# Patient Record
Sex: Female | Born: 1945
Health system: Southern US, Community
[De-identification: ages and names within clinical notes are randomized; demographics above are authoritative.]

## PROBLEM LIST (undated history)

## (undated) DIAGNOSIS — I10 Essential (primary) hypertension: Secondary | ICD-10-CM

## (undated) DIAGNOSIS — E785 Hyperlipidemia, unspecified: Secondary | ICD-10-CM

## (undated) HISTORY — DX: Hyperlipidemia, unspecified: E78.5

## (undated) HISTORY — DX: Essential (primary) hypertension: I10

---

## 1988-11-26 HISTORY — PX: BREAST BIOPSY: SHX20

## 1989-11-26 HISTORY — PX: BREAST BIOPSY: SHX20

## 1990-11-26 HISTORY — PX: KNEE SURGERY: SHX244

## 1990-11-26 HISTORY — PX: BREAST BIOPSY: SHX20

## 1996-11-26 HISTORY — PX: BREAST BIOPSY: SHX20

## 1999-03-10 ENCOUNTER — Other Ambulatory Visit: Admission: RE | Admit: 1999-03-10 | Discharge: 1999-03-10 | Payer: Self-pay | Admitting: *Deleted

## 2000-04-17 ENCOUNTER — Other Ambulatory Visit: Admission: RE | Admit: 2000-04-17 | Discharge: 2000-04-17 | Payer: Self-pay | Admitting: *Deleted

## 2000-07-01 ENCOUNTER — Encounter: Payer: Self-pay | Admitting: *Deleted

## 2000-07-01 ENCOUNTER — Encounter: Admission: RE | Admit: 2000-07-01 | Discharge: 2000-07-01 | Payer: Self-pay | Admitting: *Deleted

## 2000-08-01 ENCOUNTER — Encounter: Admission: RE | Admit: 2000-08-01 | Discharge: 2000-08-01 | Payer: Self-pay | Admitting: Family Medicine

## 2000-08-01 ENCOUNTER — Encounter: Payer: Self-pay | Admitting: Family Medicine

## 2001-07-09 ENCOUNTER — Encounter: Payer: Self-pay | Admitting: *Deleted

## 2001-07-09 ENCOUNTER — Encounter: Admission: RE | Admit: 2001-07-09 | Discharge: 2001-07-09 | Payer: Self-pay | Admitting: *Deleted

## 2001-07-15 ENCOUNTER — Encounter: Admission: RE | Admit: 2001-07-15 | Discharge: 2001-07-15 | Payer: Self-pay | Admitting: *Deleted

## 2001-07-15 ENCOUNTER — Encounter: Payer: Self-pay | Admitting: *Deleted

## 2001-08-07 ENCOUNTER — Other Ambulatory Visit: Admission: RE | Admit: 2001-08-07 | Discharge: 2001-08-07 | Payer: Self-pay | Admitting: *Deleted

## 2002-07-21 ENCOUNTER — Encounter: Admission: RE | Admit: 2002-07-21 | Discharge: 2002-07-21 | Payer: Self-pay | Admitting: *Deleted

## 2002-07-21 ENCOUNTER — Encounter: Payer: Self-pay | Admitting: *Deleted

## 2002-09-11 ENCOUNTER — Other Ambulatory Visit: Admission: RE | Admit: 2002-09-11 | Discharge: 2002-09-11 | Payer: Self-pay | Admitting: *Deleted

## 2003-07-29 ENCOUNTER — Encounter: Admission: RE | Admit: 2003-07-29 | Discharge: 2003-07-29 | Payer: Self-pay | Admitting: *Deleted

## 2003-07-29 ENCOUNTER — Encounter: Payer: Self-pay | Admitting: *Deleted

## 2003-09-16 ENCOUNTER — Other Ambulatory Visit: Admission: RE | Admit: 2003-09-16 | Discharge: 2003-09-16 | Payer: Self-pay | Admitting: *Deleted

## 2004-08-18 ENCOUNTER — Encounter: Admission: RE | Admit: 2004-08-18 | Discharge: 2004-08-18 | Payer: Self-pay | Admitting: *Deleted

## 2004-10-23 ENCOUNTER — Other Ambulatory Visit: Admission: RE | Admit: 2004-10-23 | Discharge: 2004-10-23 | Payer: Self-pay | Admitting: *Deleted

## 2004-10-26 ENCOUNTER — Encounter: Admission: RE | Admit: 2004-10-26 | Discharge: 2004-10-26 | Payer: Self-pay | Admitting: *Deleted

## 2004-11-07 ENCOUNTER — Ambulatory Visit (HOSPITAL_COMMUNITY): Admission: RE | Admit: 2004-11-07 | Discharge: 2004-11-07 | Payer: Self-pay | Admitting: Gastroenterology

## 2005-09-10 ENCOUNTER — Encounter: Admission: RE | Admit: 2005-09-10 | Discharge: 2005-09-10 | Payer: Self-pay | Admitting: Family Medicine

## 2005-12-18 ENCOUNTER — Other Ambulatory Visit: Admission: RE | Admit: 2005-12-18 | Discharge: 2005-12-18 | Payer: Self-pay | Admitting: Family Medicine

## 2006-09-16 ENCOUNTER — Encounter: Admission: RE | Admit: 2006-09-16 | Discharge: 2006-09-16 | Payer: Self-pay | Admitting: Family Medicine

## 2007-03-04 ENCOUNTER — Other Ambulatory Visit: Admission: RE | Admit: 2007-03-04 | Discharge: 2007-03-04 | Payer: Self-pay | Admitting: Family Medicine

## 2007-03-13 ENCOUNTER — Encounter: Admission: RE | Admit: 2007-03-13 | Discharge: 2007-03-13 | Payer: Self-pay | Admitting: Family Medicine

## 2007-09-19 ENCOUNTER — Encounter: Admission: RE | Admit: 2007-09-19 | Discharge: 2007-09-19 | Payer: Self-pay | Admitting: Family Medicine

## 2008-07-02 ENCOUNTER — Other Ambulatory Visit: Admission: RE | Admit: 2008-07-02 | Discharge: 2008-07-02 | Payer: Self-pay | Admitting: Family Medicine

## 2008-09-20 ENCOUNTER — Encounter: Admission: RE | Admit: 2008-09-20 | Discharge: 2008-09-20 | Payer: Self-pay | Admitting: Family Medicine

## 2009-04-04 ENCOUNTER — Encounter: Admission: RE | Admit: 2009-04-04 | Discharge: 2009-04-04 | Payer: Self-pay | Admitting: Emergency Medicine

## 2009-07-06 ENCOUNTER — Other Ambulatory Visit: Admission: RE | Admit: 2009-07-06 | Discharge: 2009-07-06 | Payer: Self-pay | Admitting: Family Medicine

## 2009-07-28 ENCOUNTER — Encounter: Admission: RE | Admit: 2009-07-28 | Discharge: 2009-07-28 | Payer: Self-pay | Admitting: Family Medicine

## 2009-09-29 ENCOUNTER — Encounter: Admission: RE | Admit: 2009-09-29 | Discharge: 2009-09-29 | Payer: Self-pay | Admitting: Family Medicine

## 2010-09-08 ENCOUNTER — Other Ambulatory Visit: Admission: RE | Admit: 2010-09-08 | Discharge: 2010-09-08 | Payer: Self-pay | Admitting: Family Medicine

## 2010-10-02 ENCOUNTER — Encounter: Admission: RE | Admit: 2010-10-02 | Discharge: 2010-10-02 | Payer: Self-pay | Admitting: Family Medicine

## 2011-04-13 NOTE — Op Note (Signed)
NAMESARAHGRACE, BROMAN            ACCOUNT NO.:  [id number]   MEDICAL RECORD NO.:  [id number]          PATIENT TYPE:  AMB   LOCATION:  ENDO                         FACILITY:  [facility]   PHYSICIAN:  James L. Malon Kindle., M.D.DATE OF BIRTH:  1946-03-19   DATE OF PROCEDURE:  11/07/2004  DATE OF DISCHARGE:                                 OPERATIVE REPORT   PROCEDURE PERFORMED:  Colonoscopy.   ENDOSCOPIST:  Llana Aliment. Edwards, M.D.   MEDICATIONS:  Fentanyl 75 mcg, Versed 7.5 mg IV.   INSTRUMENT USED:  Pediatric Olympus video colonoscope.   INDICATIONS FOR PROCEDURE:  Colon cancer screening.   DESCRIPTION OF PROCEDURE:  The procedure had been explained to the patient  and consent obtained.  With the patient in the left lateral decubitus  position, the pediatric Olympus colonoscope was inserted and advanced.  The  prep was excellent.  The patient had a somewhat long, tortuous colon and we  were able to reach the cecum.  The ileocecal valve was seen.  The scope was  withdrawn and the cecum, ascending colon, transverse colon, splenic flexure,  descending and sigmoid colon were seen well upon removal.  No polyps were  seen.  There as no diverticular disease.  The scope was withdrawn.  The  patient tolerated the procedure well.   ASSESSMENT:  Essentially normal screening colonoscopy.  [id number].   PLAN:  Will recommend yearly Hemoccults, possibly repeat procedure in 10  years.       JLE/MEDQ  D:  11/07/2004  T:  11/07/2004  Job:  [id number]   cc:   Duncan Dull, M.D.  [address]  [place]  [place] [address]  Fax: [phone]

## 2011-08-23 LAB — LAB REPORT - SCANNED: EGFR: 78.3

## 2011-09-11 ENCOUNTER — Other Ambulatory Visit: Payer: Self-pay | Admitting: Family Medicine

## 2011-09-11 DIAGNOSIS — Z1231 Encounter for screening mammogram for malignant neoplasm of breast: Secondary | ICD-10-CM

## 2011-10-04 ENCOUNTER — Ambulatory Visit
Admission: RE | Admit: 2011-10-04 | Discharge: 2011-10-04 | Disposition: A | Discharge status: Home / Self Care | Payer: PRIVATE HEALTH INSURANCE | Source: Ambulatory Visit | Attending: Family Medicine | Admitting: Family Medicine

## 2011-10-04 DIAGNOSIS — Z1231 Encounter for screening mammogram for malignant neoplasm of breast: Secondary | ICD-10-CM

## 2012-09-30 ENCOUNTER — Other Ambulatory Visit: Payer: Self-pay | Admitting: Family Medicine

## 2012-09-30 DIAGNOSIS — Z1231 Encounter for screening mammogram for malignant neoplasm of breast: Secondary | ICD-10-CM

## 2012-11-10 ENCOUNTER — Ambulatory Visit
Admission: RE | Admit: 2012-11-10 | Discharge: 2012-11-10 | Disposition: A | Discharge status: Home / Self Care | Payer: BC Managed Care – PPO | Source: Ambulatory Visit | Attending: Family Medicine | Admitting: Family Medicine

## 2012-11-10 DIAGNOSIS — Z1231 Encounter for screening mammogram for malignant neoplasm of breast: Secondary | ICD-10-CM

## 2013-02-26 ENCOUNTER — Other Ambulatory Visit: Payer: Self-pay | Admitting: Family Medicine

## 2013-02-26 DIAGNOSIS — M8580 Other specified disorders of bone density and structure, unspecified site: Secondary | ICD-10-CM

## 2013-03-24 ENCOUNTER — Other Ambulatory Visit: Payer: BC Managed Care – PPO

## 2013-04-29 ENCOUNTER — Ambulatory Visit
Admission: RE | Admit: 2013-04-29 | Discharge: 2013-04-29 | Disposition: A | Discharge status: Home / Self Care | Payer: BC Managed Care – HMO | Source: Ambulatory Visit | Attending: Family Medicine | Admitting: Family Medicine

## 2013-04-29 DIAGNOSIS — M8580 Other specified disorders of bone density and structure, unspecified site: Secondary | ICD-10-CM

## 2013-04-29 LAB — HM DEXA SCAN

## 2013-10-26 ENCOUNTER — Other Ambulatory Visit: Payer: Self-pay

## 2013-10-26 DIAGNOSIS — Z1231 Encounter for screening mammogram for malignant neoplasm of breast: Secondary | ICD-10-CM

## 2013-12-14 ENCOUNTER — Ambulatory Visit
Admission: RE | Admit: 2013-12-14 | Discharge: 2013-12-14 | Disposition: A | Discharge status: Home / Self Care | Payer: BC Managed Care – PPO | Source: Ambulatory Visit

## 2013-12-14 DIAGNOSIS — Z1231 Encounter for screening mammogram for malignant neoplasm of breast: Secondary | ICD-10-CM

## 2014-03-04 LAB — LAB REPORT - SCANNED: EGFR: 75

## 2014-11-23 ENCOUNTER — Other Ambulatory Visit: Payer: Self-pay

## 2014-11-23 DIAGNOSIS — Z1231 Encounter for screening mammogram for malignant neoplasm of breast: Secondary | ICD-10-CM

## 2014-12-17 ENCOUNTER — Ambulatory Visit: Payer: BC Managed Care – PPO

## 2014-12-22 ENCOUNTER — Ambulatory Visit
Admission: RE | Admit: 2014-12-22 | Discharge: 2014-12-22 | Disposition: A | Discharge status: Home / Self Care | Payer: Self-pay | Source: Ambulatory Visit

## 2014-12-22 DIAGNOSIS — Z1231 Encounter for screening mammogram for malignant neoplasm of breast: Secondary | ICD-10-CM

## 2015-11-16 ENCOUNTER — Other Ambulatory Visit: Payer: Self-pay

## 2015-11-16 DIAGNOSIS — Z1231 Encounter for screening mammogram for malignant neoplasm of breast: Secondary | ICD-10-CM

## 2015-12-26 ENCOUNTER — Ambulatory Visit: Payer: BLUE CROSS/BLUE SHIELD

## 2015-12-27 ENCOUNTER — Ambulatory Visit
Admission: RE | Admit: 2015-12-27 | Discharge: 2015-12-27 | Disposition: A | Discharge status: Home / Self Care | Payer: BLUE CROSS/BLUE SHIELD | Source: Ambulatory Visit

## 2015-12-27 DIAGNOSIS — Z1231 Encounter for screening mammogram for malignant neoplasm of breast: Secondary | ICD-10-CM

## 2016-03-15 ENCOUNTER — Other Ambulatory Visit: Payer: Self-pay | Admitting: Family Medicine

## 2016-03-15 DIAGNOSIS — M8580 Other specified disorders of bone density and structure, unspecified site: Secondary | ICD-10-CM

## 2016-04-03 ENCOUNTER — Ambulatory Visit
Admission: RE | Admit: 2016-04-03 | Discharge: 2016-04-03 | Disposition: A | Discharge status: Home / Self Care | Payer: BLUE CROSS/BLUE SHIELD | Source: Ambulatory Visit | Attending: Family Medicine | Admitting: Family Medicine

## 2016-04-03 DIAGNOSIS — M8580 Other specified disorders of bone density and structure, unspecified site: Secondary | ICD-10-CM

## 2016-11-28 ENCOUNTER — Other Ambulatory Visit: Payer: Self-pay | Admitting: Family Medicine

## 2016-11-28 DIAGNOSIS — Z1231 Encounter for screening mammogram for malignant neoplasm of breast: Secondary | ICD-10-CM

## 2016-12-27 ENCOUNTER — Ambulatory Visit: Payer: BLUE CROSS/BLUE SHIELD

## 2017-01-02 ENCOUNTER — Ambulatory Visit
Admission: RE | Admit: 2017-01-02 | Discharge: 2017-01-02 | Disposition: A | Discharge status: Home / Self Care | Payer: BLUE CROSS/BLUE SHIELD | Source: Ambulatory Visit | Attending: Family Medicine | Admitting: Family Medicine

## 2017-01-02 DIAGNOSIS — Z1231 Encounter for screening mammogram for malignant neoplasm of breast: Secondary | ICD-10-CM

## 2017-03-27 LAB — LAB REPORT - SCANNED: EGFR: 77

## 2017-11-22 ENCOUNTER — Other Ambulatory Visit: Payer: Self-pay | Admitting: Family Medicine

## 2017-11-22 DIAGNOSIS — Z1231 Encounter for screening mammogram for malignant neoplasm of breast: Secondary | ICD-10-CM

## 2017-12-05 DIAGNOSIS — R69 Illness, unspecified: Secondary | ICD-10-CM | POA: Diagnosis not present

## 2017-12-12 DIAGNOSIS — R69 Illness, unspecified: Secondary | ICD-10-CM | POA: Diagnosis not present

## 2017-12-14 ENCOUNTER — Encounter (HOSPITAL_COMMUNITY): Payer: Self-pay | Admitting: Emergency Medicine

## 2017-12-14 ENCOUNTER — Other Ambulatory Visit: Payer: Self-pay

## 2017-12-14 DIAGNOSIS — R0602 Shortness of breath: Secondary | ICD-10-CM | POA: Insufficient documentation

## 2017-12-14 DIAGNOSIS — R4182 Altered mental status, unspecified: Secondary | ICD-10-CM | POA: Diagnosis not present

## 2017-12-14 DIAGNOSIS — R42 Dizziness and giddiness: Secondary | ICD-10-CM | POA: Diagnosis not present

## 2017-12-14 DIAGNOSIS — R7989 Other specified abnormal findings of blood chemistry: Secondary | ICD-10-CM | POA: Diagnosis not present

## 2017-12-14 DIAGNOSIS — R55 Syncope and collapse: Secondary | ICD-10-CM | POA: Diagnosis not present

## 2017-12-14 DIAGNOSIS — R404 Transient alteration of awareness: Secondary | ICD-10-CM | POA: Diagnosis not present

## 2017-12-14 DIAGNOSIS — I700 Atherosclerosis of aorta: Secondary | ICD-10-CM | POA: Diagnosis not present

## 2017-12-14 LAB — CBC
HEMATOCRIT: 38.4 % (ref 36.0–46.0)
HEMOGLOBIN: 12.9 g/dL (ref 12.0–15.0)
MCH: 32.4 pg (ref 26.0–34.0)
MCHC: 33.6 g/dL (ref 30.0–36.0)
MCV: 96.5 fL (ref 78.0–100.0)
Platelets: 253 10*3/uL (ref 150–400)
RBC: 3.98 MIL/uL (ref 3.87–5.11)
RDW: 12.9 % (ref 11.5–15.5)
WBC: 9.8 10*3/uL (ref 4.0–10.5)

## 2017-12-14 LAB — BASIC METABOLIC PANEL
ANION GAP: 9 (ref 5–15)
BUN: 17 mg/dL (ref 6–20)
CHLORIDE: 104 mmol/L (ref 101–111)
CO2: 24 mmol/L (ref 22–32)
Calcium: 9.2 mg/dL (ref 8.9–10.3)
Creatinine, Ser: 0.86 mg/dL (ref 0.44–1.00)
GFR calc non Af Amer: >60 mL/min (ref >60)
GLUCOSE: 112 mg/dL — AB (ref 65–99)
POTASSIUM: 3.6 mmol/L (ref 3.5–5.1)
Sodium: 137 mmol/L (ref 135–145)

## 2017-12-14 LAB — URINALYSIS, ROUTINE W REFLEX MICROSCOPIC
BILIRUBIN URINE: NEGATIVE
Glucose, UA: NEGATIVE mg/dL
HGB URINE DIPSTICK: NEGATIVE
Ketones, ur: NEGATIVE mg/dL
Leukocytes, UA: NEGATIVE
Nitrite: NEGATIVE
PH: 6 (ref 5.0–8.0)
Protein, ur: NEGATIVE mg/dL
SPECIFIC GRAVITY, URINE: 1.009 (ref 1.005–1.030)

## 2017-12-14 NOTE — ED Triage Notes (Signed)
Pt arrived EMS from home after a near syncopal episode. Per EMS pt was at home and had episode where she "spaced out" pt states that she did not loss conciousness and remembers the event. BP138/74 P80 CBG120 97%RA

## 2017-12-15 ENCOUNTER — Emergency Department (HOSPITAL_COMMUNITY): Payer: Medicare HMO

## 2017-12-15 ENCOUNTER — Emergency Department (HOSPITAL_COMMUNITY)
Admission: EM | Admit: 2017-12-15 | Discharge: 2017-12-15 | Disposition: A | Discharge status: Home / Self Care | Payer: Medicare HMO | Attending: Emergency Medicine | Admitting: Emergency Medicine

## 2017-12-15 DIAGNOSIS — R55 Syncope and collapse: Secondary | ICD-10-CM

## 2017-12-15 DIAGNOSIS — R42 Dizziness and giddiness: Secondary | ICD-10-CM | POA: Diagnosis not present

## 2017-12-15 DIAGNOSIS — I700 Atherosclerosis of aorta: Secondary | ICD-10-CM | POA: Diagnosis not present

## 2017-12-15 DIAGNOSIS — R4182 Altered mental status, unspecified: Secondary | ICD-10-CM | POA: Diagnosis not present

## 2017-12-15 DIAGNOSIS — R7989 Other specified abnormal findings of blood chemistry: Secondary | ICD-10-CM | POA: Diagnosis not present

## 2017-12-15 DIAGNOSIS — R0602 Shortness of breath: Secondary | ICD-10-CM | POA: Diagnosis not present

## 2017-12-15 LAB — RAPID URINE DRUG SCREEN, HOSP PERFORMED
Amphetamines: NOT DETECTED
Barbiturates: NOT DETECTED
Benzodiazepines: NOT DETECTED
COCAINE: NOT DETECTED
OPIATES: NOT DETECTED
TETRAHYDROCANNABINOL: NOT DETECTED

## 2017-12-15 LAB — D-DIMER, QUANTITATIVE: D-Dimer, Quant: 2.3 ug/mL-FEU — ABNORMAL HIGH (ref 0.00–0.50)

## 2017-12-15 LAB — TROPONIN I
Troponin I: <0.03 ng/mL (ref <0.03)
Troponin I: <0.03 ng/mL (ref <0.03)

## 2017-12-15 MED ORDER — IOPAMIDOL (ISOVUE-370) INJECTION 76%
INTRAVENOUS | Status: AC
  Administered 2017-12-15: 100 mL
  Filled 2017-12-15: qty 100

## 2017-12-15 NOTE — ED Provider Notes (Signed)
[facility] EMERGENCY DEPARTMENT Provider Note   CSN: [id number] Arrival date & time: 12/14/17  2032     History   Chief Complaint Chief Complaint  Patient presents with  . Near Syncope    HPI Morgan Carter is a 72 y.o. female.  Patient presents from home after near syncopal episode.  She reports that she was standing in the kitchen when she began to feel lightheaded and went to sit down.  Her family helps her to get to a chair.  She reports she may have lost consciousness for secondary to but remembers coming to the chair to members her family around her.  She denies any chest pain or shortness of breath.  She denies any blurry vision or double vision.  Denies any nausea or vomiting.  Admits that she ate poorly today and may have not had enough fluids.  She also has taken 2 doses of prednisone in the past 2 days because she was around cats which she is allergic to.  She reports she felt better after sitting down by the time EMS arrived.  She did lose control of her bladder.  No tongue biting.  No seizure-like activity.  She denies any cardiac history.  She states she has passed out in the past from overheating.  Her only prescribed medication is for blood pressure.  No focal weakness, numbness or tingling.   The history is provided by the patient, the EMS personnel and a friend.  Near Syncope  Pertinent negatives include no chest pain, no abdominal pain, no headaches and no shortness of breath.    History reviewed. No pertinent past medical history.  There are no active problems to display for this patient.   Past Surgical History:  Procedure Laterality Date  . BREAST BIOPSY Left 1998   benign  . BREAST BIOPSY Right 1992   benign  . BREAST BIOPSY Right 1991   benign  . BREAST BIOPSY Right 1990   benign    OB History    No data available       Home Medications    Prior to Admission medications   Not on File    Family History No family  history on file.  Social History Social History   Tobacco Use  . Smoking status: Never Smoker  . Smokeless tobacco: Never Used  Substance Use Topics  . Alcohol use: Not on file  . Drug use: Not on file     Allergies   Patient has no known allergies.   Review of Systems Review of Systems  Constitutional: Positive for diaphoresis and fatigue. Negative for activity change, appetite change and fever.  HENT: Negative for congestion and rhinorrhea.   Respiratory: Negative for cough, chest tightness and shortness of breath.   Cardiovascular: Positive for near-syncope. Negative for chest pain.  Gastrointestinal: Negative for abdominal pain, nausea and vomiting.  Genitourinary: Negative for dysuria, hematuria, vaginal bleeding and vaginal discharge.  Musculoskeletal: Negative for arthralgias and myalgias.  Skin: Negative for rash.  Neurological: Positive for dizziness, weakness and light-headedness. Negative for headaches.    all other systems are negative except as noted in the HPI and PMH.    Physical Exam Updated Vital Signs BP 136/78 (BP Location: Right Arm)   Pulse 74   Temp 98 F (36.7 C) (Oral)   Resp 16   Ht 5\' 5"  (1.651 m)   Wt 65.8 kg (145 lb)   SpO2 99%   BMI 24.13 kg/m   Physical  Exam  Constitutional: She is oriented to person, place, and time. She appears well-developed and well-nourished. No distress.  HENT:  Head: Normocephalic and atraumatic.  Mouth/Throat: Oropharynx is clear and moist. No oropharyngeal exudate.  Eyes: Conjunctivae and EOM are normal. Pupils are equal, round, and reactive to light.  Neck: Normal range of motion. Neck supple.  No meningismus.  Cardiovascular: Normal rate, regular rhythm, normal heart sounds and intact distal pulses.  No murmur heard. Pulmonary/Chest: Effort normal and breath sounds normal. No respiratory distress.  Abdominal: Soft. There is no tenderness. There is no rebound and no guarding.  Musculoskeletal: Normal  range of motion. She exhibits no edema or tenderness.  Neurological: She is alert and oriented to person, place, and time. No cranial nerve deficit. She exhibits normal muscle tone. Coordination normal.  CN 2-12 intact, no ataxia on finger to nose, no nystagmus, 5/5 strength throughout, no pronator drift, Romberg negative, normal gait.   Skin: Skin is warm.  Psychiatric: She has a normal mood and affect. Her behavior is normal.  Nursing note and vitals reviewed.    ED Treatments / Results  Labs (all labs ordered are listed, but only abnormal results are displayed) Labs Reviewed  BASIC METABOLIC PANEL - Abnormal; Notable for the following components:      Result Value   Glucose, Bld 112 (*)    All other components within normal limits  D-DIMER, QUANTITATIVE (NOT AT [facility]) - Abnormal; Notable for the following components:   D-Dimer, Quant 2.30 (*)    All other components within normal limits  CBC  URINALYSIS, ROUTINE W REFLEX MICROSCOPIC  TROPONIN I  RAPID URINE DRUG SCREEN, HOSP PERFORMED  TROPONIN I    EKG  EKG Interpretation  Date/Time:  Saturday December 14 2017 20:36:51 EST Ventricular Rate:  76 PR Interval:  176 QRS Duration: 76 QT Interval:  380 QTC Calculation: 427 R Axis:   -3 Text Interpretation:  Normal sinus rhythm Septal infarct , age undetermined Abnormal ECG No previous ECGs available Confirmed by Ezequiel Essex [id number] on 12/15/2017 12:04:35 AM       Radiology Ct Head Wo Contrast  Result Date: 12/15/2017 CLINICAL DATA:  72 year old female with altered mental status. EXAM: CT HEAD WITHOUT CONTRAST TECHNIQUE: Contiguous axial images were obtained from the base of the skull through the vertex without intravenous contrast. COMPARISON:  None. FINDINGS: Brain: Mild cortical atrophy. The gray-white matter discrimination is preserved. There is no acute intracranial hemorrhage. No mass effect or midline shift. No extra-axial fluid collection. Vascular: No hyperdense  vessel or unexpected calcification. Skull: Normal. Negative for fracture or focal lesion. Sinuses/Orbits: No acute finding. Other: None IMPRESSION: No acute intracranial pathology. Electronically Signed   By: Anner Crete M.D.   On: 12/15/2017 01:40   Ct Angio Chest Pe W And/or Wo Contrast  Result Date: 12/15/2017 CLINICAL DATA:  Shortness of breath and positive D-dimer.  Syncope. EXAM: CT ANGIOGRAPHY CHEST WITH CONTRAST TECHNIQUE: Multidetector CT imaging of the chest was performed using the standard protocol during bolus administration of intravenous contrast. Multiplanar CT image reconstructions and MIPs were obtained to evaluate the vascular anatomy. CONTRAST:  191mL ISOVUE-370 IOPAMIDOL (ISOVUE-370) INJECTION 76% COMPARISON:  None. FINDINGS: Cardiovascular: Contrast injection is sufficient to demonstrate satisfactory opacification of the pulmonary arteries to the segmental level. There is no pulmonary embolus. The main pulmonary artery is within normal limits for size. There is no CT evidence of acute right heart strain. There is mild calcific aortic atherosclerosis. There is a normal 3-vessel  arch branching pattern. Heart size is normal, without pericardial effusion. Mediastinum/Nodes: No mediastinal, hilar or axillary lymphadenopathy. The visualized thyroid and thoracic esophageal course are unremarkable. Lungs/Pleura: No pulmonary nodules or masses. No pleural effusion or pneumothorax. No focal airspace consolidation. No focal pleural abnormality. Upper Abdomen: Contrast bolus timing is not optimized for evaluation of the abdominal organs. Within this limitation, the visualized organs of the upper abdomen are normal. Musculoskeletal: No chest wall abnormality. No acute or significant osseous findings. Review of the MIP images confirms the above findings. IMPRESSION: 1. No pulmonary embolus or other acute thoracic abnormality. 2.  Aortic Atherosclerosis (ICD10-I70.0). Electronically Signed   By: Ulyses Jarred M.D.   On: 12/15/2017 03:15    Procedures Procedures (including critical care time)  Medications Ordered in ED Medications - No data to display   Initial Impression / Assessment and Plan / ED Course  I have reviewed the triage vital signs and the nursing notes.  Pertinent labs & imaging results that were available during my care of the patient were reviewed by me and considered in my medical decision making (see chart for details).    Patient from home after near syncopal episode.  No chest pain or shortness of breath.  Episode sounds vasovagal.  Patient did have prodrome with urinary incontinence but no seizure-like activity. She is asymptomatic at this time.  EKG is normal sinus rhythm, no Brugada, no prolonged QT Labs reassuring.  Troponin negative.  Slight elevation of heart rate by 10 points with standing.  Patient given IV and p.o. fluids.  D-dimer is elevated but CT PE is negative.  Patient is ambulatory and asymptomatic.  She is tolerating p.o.  Troponin negative x2.  Low suspicion for cardiac etiology of syncope. Suspect vasovagal syncope or perhaps adverse reaction to prednisone.  Patient has nonfocal neurological exam is tolerated p.o. and amatory.  Discussed increased hydration at home.  Follow with PCP.  Return precautions discussed. Final Clinical Impressions(s) / ED Diagnoses   Final diagnoses:  Near syncope    ED Discharge Orders    None       Ramatoulaye Pack, Annie Main, MD 12/15/17 [phone]

## 2017-12-15 NOTE — Discharge Instructions (Signed)
Your testing is reassuring.  There is no evidence of heart attack or blood clot in the lung.  Keep yourself hydrated.  Follow-up with your doctor.  Return to the ED if you develop chest pain, shortness of breath or any other concerns.

## 2017-12-15 NOTE — ED Notes (Signed)
Pt ambulated independently w/ a steady gait. Denied dizziness.  Pt given ginger ale and is tolerating w/o difficulty.

## 2017-12-15 NOTE — ED Notes (Signed)
Patient transported to CT 

## 2017-12-15 NOTE — ED Notes (Signed)
Pt ambulatory to restroom without any difficulties. Pt denies lightheadedness or dizziness.

## 2017-12-25 DIAGNOSIS — I10 Essential (primary) hypertension: Secondary | ICD-10-CM | POA: Diagnosis not present

## 2017-12-25 DIAGNOSIS — R55 Syncope and collapse: Secondary | ICD-10-CM | POA: Diagnosis not present

## 2017-12-26 DIAGNOSIS — R69 Illness, unspecified: Secondary | ICD-10-CM | POA: Diagnosis not present

## 2017-12-31 DIAGNOSIS — R69 Illness, unspecified: Secondary | ICD-10-CM | POA: Diagnosis not present

## 2018-01-03 ENCOUNTER — Ambulatory Visit: Payer: BLUE CROSS/BLUE SHIELD

## 2018-01-15 ENCOUNTER — Ambulatory Visit
Admission: RE | Admit: 2018-01-15 | Discharge: 2018-01-15 | Disposition: A | Discharge status: Home / Self Care | Payer: Medicare HMO | Source: Ambulatory Visit | Attending: Family Medicine | Admitting: Family Medicine

## 2018-01-15 DIAGNOSIS — Z1231 Encounter for screening mammogram for malignant neoplasm of breast: Secondary | ICD-10-CM

## 2018-01-16 DIAGNOSIS — R69 Illness, unspecified: Secondary | ICD-10-CM | POA: Diagnosis not present

## 2018-01-28 DIAGNOSIS — R69 Illness, unspecified: Secondary | ICD-10-CM | POA: Diagnosis not present

## 2018-01-29 DIAGNOSIS — H1013 Acute atopic conjunctivitis, bilateral: Secondary | ICD-10-CM | POA: Diagnosis not present

## 2018-02-06 DIAGNOSIS — R69 Illness, unspecified: Secondary | ICD-10-CM | POA: Diagnosis not present

## 2018-03-04 DIAGNOSIS — R69 Illness, unspecified: Secondary | ICD-10-CM | POA: Diagnosis not present

## 2018-03-13 DIAGNOSIS — R69 Illness, unspecified: Secondary | ICD-10-CM | POA: Diagnosis not present

## 2018-04-09 ENCOUNTER — Other Ambulatory Visit: Payer: Self-pay | Admitting: Family Medicine

## 2018-04-09 DIAGNOSIS — R69 Illness, unspecified: Secondary | ICD-10-CM | POA: Diagnosis not present

## 2018-04-09 DIAGNOSIS — M8580 Other specified disorders of bone density and structure, unspecified site: Secondary | ICD-10-CM | POA: Diagnosis not present

## 2018-04-09 DIAGNOSIS — I10 Essential (primary) hypertension: Secondary | ICD-10-CM | POA: Diagnosis not present

## 2018-04-09 DIAGNOSIS — I700 Atherosclerosis of aorta: Secondary | ICD-10-CM | POA: Diagnosis not present

## 2018-04-09 DIAGNOSIS — Z1159 Encounter for screening for other viral diseases: Secondary | ICD-10-CM | POA: Diagnosis not present

## 2018-04-09 DIAGNOSIS — E7800 Pure hypercholesterolemia, unspecified: Secondary | ICD-10-CM | POA: Diagnosis not present

## 2018-04-09 DIAGNOSIS — Z0000 Encounter for general adult medical examination without abnormal findings: Secondary | ICD-10-CM | POA: Diagnosis not present

## 2018-04-10 LAB — HM HEPATITIS C SCREENING LAB: HM Hepatitis Screen: NEGATIVE

## 2018-04-28 DIAGNOSIS — R69 Illness, unspecified: Secondary | ICD-10-CM | POA: Diagnosis not present

## 2018-07-07 ENCOUNTER — Ambulatory Visit
Admission: RE | Admit: 2018-07-07 | Discharge: 2018-07-07 | Disposition: A | Discharge status: Home / Self Care | Payer: Medicare HMO | Source: Ambulatory Visit | Attending: Family Medicine | Admitting: Family Medicine

## 2018-07-07 DIAGNOSIS — Z780 Asymptomatic menopausal state: Secondary | ICD-10-CM | POA: Diagnosis not present

## 2018-07-07 DIAGNOSIS — M8580 Other specified disorders of bone density and structure, unspecified site: Secondary | ICD-10-CM

## 2018-07-07 DIAGNOSIS — M85851 Other specified disorders of bone density and structure, right thigh: Secondary | ICD-10-CM | POA: Diagnosis not present

## 2018-07-14 DIAGNOSIS — E7800 Pure hypercholesterolemia, unspecified: Secondary | ICD-10-CM | POA: Diagnosis not present

## 2018-09-22 DIAGNOSIS — E7800 Pure hypercholesterolemia, unspecified: Secondary | ICD-10-CM | POA: Diagnosis not present

## 2018-09-22 DIAGNOSIS — Z79899 Other long term (current) drug therapy: Secondary | ICD-10-CM | POA: Diagnosis not present

## 2018-12-03 ENCOUNTER — Other Ambulatory Visit: Payer: Self-pay | Admitting: Family Medicine

## 2018-12-03 DIAGNOSIS — Z1231 Encounter for screening mammogram for malignant neoplasm of breast: Secondary | ICD-10-CM

## 2019-01-19 ENCOUNTER — Ambulatory Visit: Payer: Medicare HMO

## 2019-05-14 DIAGNOSIS — Z0000 Encounter for general adult medical examination without abnormal findings: Secondary | ICD-10-CM | POA: Diagnosis not present

## 2019-05-14 DIAGNOSIS — I10 Essential (primary) hypertension: Secondary | ICD-10-CM | POA: Diagnosis not present

## 2019-05-14 DIAGNOSIS — E7800 Pure hypercholesterolemia, unspecified: Secondary | ICD-10-CM | POA: Diagnosis not present

## 2019-05-14 DIAGNOSIS — I700 Atherosclerosis of aorta: Secondary | ICD-10-CM | POA: Diagnosis not present

## 2019-06-29 ENCOUNTER — Ambulatory Visit
Admission: RE | Admit: 2019-06-29 | Discharge: 2019-06-29 | Disposition: A | Discharge status: Home / Self Care | Payer: Medicare HMO | Source: Ambulatory Visit | Attending: Family Medicine | Admitting: Family Medicine

## 2019-06-29 ENCOUNTER — Other Ambulatory Visit: Payer: Self-pay

## 2019-06-29 DIAGNOSIS — Z1231 Encounter for screening mammogram for malignant neoplasm of breast: Secondary | ICD-10-CM | POA: Diagnosis not present

## 2019-06-30 ENCOUNTER — Other Ambulatory Visit: Payer: Self-pay | Admitting: Family Medicine

## 2019-06-30 DIAGNOSIS — R928 Other abnormal and inconclusive findings on diagnostic imaging of breast: Secondary | ICD-10-CM

## 2019-07-01 ENCOUNTER — Ambulatory Visit
Admission: RE | Admit: 2019-07-01 | Discharge: 2019-07-01 | Disposition: A | Discharge status: Home / Self Care | Payer: Medicare HMO | Source: Ambulatory Visit | Attending: Family Medicine | Admitting: Family Medicine

## 2019-07-01 ENCOUNTER — Other Ambulatory Visit: Payer: Self-pay

## 2019-07-01 DIAGNOSIS — R928 Other abnormal and inconclusive findings on diagnostic imaging of breast: Secondary | ICD-10-CM

## 2019-07-01 DIAGNOSIS — N6002 Solitary cyst of left breast: Secondary | ICD-10-CM | POA: Diagnosis not present

## 2019-08-27 DIAGNOSIS — Z23 Encounter for immunization: Secondary | ICD-10-CM | POA: Diagnosis not present

## 2019-11-23 DIAGNOSIS — E785 Hyperlipidemia, unspecified: Secondary | ICD-10-CM | POA: Diagnosis not present

## 2019-11-23 DIAGNOSIS — J309 Allergic rhinitis, unspecified: Secondary | ICD-10-CM | POA: Diagnosis not present

## 2019-11-23 DIAGNOSIS — Z8249 Family history of ischemic heart disease and other diseases of the circulatory system: Secondary | ICD-10-CM | POA: Diagnosis not present

## 2019-11-23 DIAGNOSIS — I10 Essential (primary) hypertension: Secondary | ICD-10-CM | POA: Diagnosis not present

## 2019-11-23 DIAGNOSIS — Z825 Family history of asthma and other chronic lower respiratory diseases: Secondary | ICD-10-CM | POA: Diagnosis not present

## 2019-11-23 DIAGNOSIS — Z87891 Personal history of nicotine dependence: Secondary | ICD-10-CM | POA: Diagnosis not present

## 2019-11-23 DIAGNOSIS — R69 Illness, unspecified: Secondary | ICD-10-CM | POA: Diagnosis not present

## 2019-12-17 ENCOUNTER — Ambulatory Visit: Payer: Medicare HMO | Attending: Internal Medicine

## 2019-12-17 DIAGNOSIS — Z23 Encounter for immunization: Secondary | ICD-10-CM | POA: Insufficient documentation

## 2019-12-17 NOTE — Progress Notes (Signed)
   Covid-19 Vaccination Clinic  Name:  Morgan Carter    MRN: [id number] DOB: 1946-02-23  12/17/2019  [name] was observed post Covid-19 immunization for 30 minutes based on pre-vaccination screening without incidence. She was provided with Vaccine Information Sheet and instruction to access the V-Safe system.   [name] was instructed to call 911 with any severe reactions post vaccine: Marland Kitchen Difficulty breathing  . Swelling of your face and throat  . A fast heartbeat  . A bad rash all over your body  . Dizziness and weakness    Immunizations Administered    Name Date Dose VIS Date Route   Pfizer COVID-19 Vaccine 12/17/2019  5:10 PM 0.3 mL 11/06/2019 Intramuscular   Manufacturer: [place]   Lot: [id number]   [place]: [id number]

## 2020-01-07 ENCOUNTER — Ambulatory Visit: Payer: Medicare HMO | Attending: Internal Medicine

## 2020-01-07 DIAGNOSIS — Z23 Encounter for immunization: Secondary | ICD-10-CM | POA: Insufficient documentation

## 2020-01-07 NOTE — Progress Notes (Signed)
   Covid-19 Vaccination Clinic  Name:  Morgan Carter    MRN: [id number] DOB: May 12, 1946  01/07/2020  [name] was observed post Covid-19 immunization for 15 minutes without incidence. She was provided with Vaccine Information Sheet and instruction to access the V-Safe system.   [name] was instructed to call 911 with any severe reactions post vaccine: Marland Kitchen Difficulty breathing  . Swelling of your face and throat  . A fast heartbeat  . A bad rash all over your body  . Dizziness and weakness    Immunizations Administered    Name Date Dose VIS Date Route   Pfizer COVID-19 Vaccine 01/07/2020  1:32 PM 0.3 mL 11/06/2019 Intramuscular   Manufacturer: [place]   Lot: EN [place]   [place]: [id number]

## 2020-01-12 DIAGNOSIS — H5202 Hypermetropia, left eye: Secondary | ICD-10-CM | POA: Diagnosis not present

## 2020-01-12 DIAGNOSIS — H2513 Age-related nuclear cataract, bilateral: Secondary | ICD-10-CM | POA: Diagnosis not present

## 2020-04-09 DIAGNOSIS — E785 Hyperlipidemia, unspecified: Secondary | ICD-10-CM | POA: Diagnosis not present

## 2020-04-09 DIAGNOSIS — Z008 Encounter for other general examination: Secondary | ICD-10-CM | POA: Diagnosis not present

## 2020-04-09 DIAGNOSIS — J309 Allergic rhinitis, unspecified: Secondary | ICD-10-CM | POA: Diagnosis not present

## 2020-04-09 DIAGNOSIS — I739 Peripheral vascular disease, unspecified: Secondary | ICD-10-CM | POA: Diagnosis not present

## 2020-04-09 DIAGNOSIS — I700 Atherosclerosis of aorta: Secondary | ICD-10-CM | POA: Diagnosis not present

## 2020-04-09 DIAGNOSIS — Z8249 Family history of ischemic heart disease and other diseases of the circulatory system: Secondary | ICD-10-CM | POA: Diagnosis not present

## 2020-04-09 DIAGNOSIS — I10 Essential (primary) hypertension: Secondary | ICD-10-CM | POA: Diagnosis not present

## 2020-04-09 DIAGNOSIS — Z823 Family history of stroke: Secondary | ICD-10-CM | POA: Diagnosis not present

## 2020-04-09 DIAGNOSIS — R69 Illness, unspecified: Secondary | ICD-10-CM | POA: Diagnosis not present

## 2020-05-19 ENCOUNTER — Other Ambulatory Visit: Payer: Self-pay | Admitting: Family Medicine

## 2020-05-19 DIAGNOSIS — I700 Atherosclerosis of aorta: Secondary | ICD-10-CM | POA: Diagnosis not present

## 2020-05-19 DIAGNOSIS — I10 Essential (primary) hypertension: Secondary | ICD-10-CM | POA: Diagnosis not present

## 2020-05-19 DIAGNOSIS — Z0000 Encounter for general adult medical examination without abnormal findings: Secondary | ICD-10-CM | POA: Diagnosis not present

## 2020-05-19 DIAGNOSIS — G4700 Insomnia, unspecified: Secondary | ICD-10-CM | POA: Diagnosis not present

## 2020-05-19 DIAGNOSIS — Z1231 Encounter for screening mammogram for malignant neoplasm of breast: Secondary | ICD-10-CM

## 2020-05-19 DIAGNOSIS — E7800 Pure hypercholesterolemia, unspecified: Secondary | ICD-10-CM | POA: Diagnosis not present

## 2020-05-19 LAB — LAB REPORT - SCANNED: EGFR: 84

## 2020-07-01 ENCOUNTER — Ambulatory Visit: Payer: Medicare HMO

## 2020-07-12 ENCOUNTER — Other Ambulatory Visit: Payer: Self-pay

## 2020-07-12 ENCOUNTER — Ambulatory Visit
Admission: RE | Admit: 2020-07-12 | Discharge: 2020-07-12 | Disposition: A | Discharge status: Home / Self Care | Payer: Medicare HMO | Source: Ambulatory Visit | Attending: Family Medicine | Admitting: Family Medicine

## 2020-07-12 DIAGNOSIS — Z1231 Encounter for screening mammogram for malignant neoplasm of breast: Secondary | ICD-10-CM

## 2020-08-30 DIAGNOSIS — R69 Illness, unspecified: Secondary | ICD-10-CM | POA: Diagnosis not present

## 2020-09-21 IMAGING — MG DIGITAL SCREENING BILATERAL MAMMOGRAM WITH TOMO AND CAD
8 series · 9 of 24 positions shown · non-contrast
Comparison: Previous exam(s).

CLINICAL DATA: Screening.

EXAM:
DIGITAL SCREENING BILATERAL MAMMOGRAM WITH TOMO AND CAD

[R CC synth-2D]
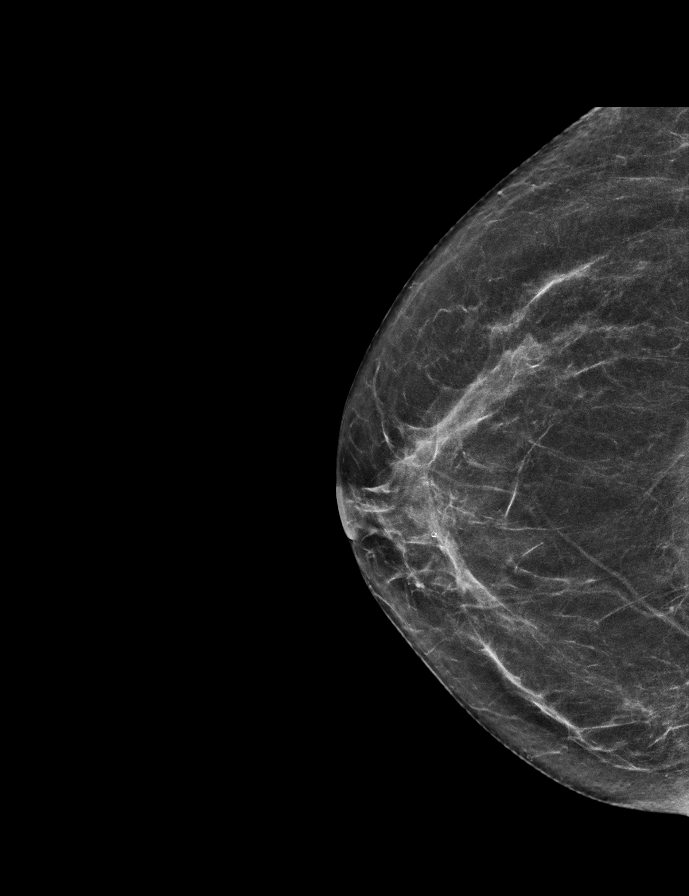

[R MLO synth-2D]
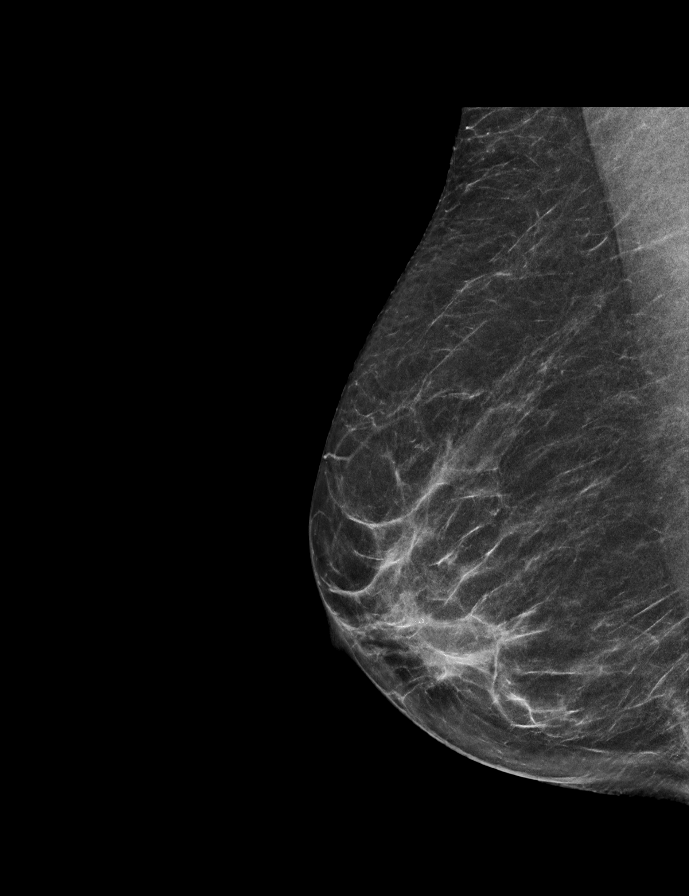

[L MLO synth-2D]
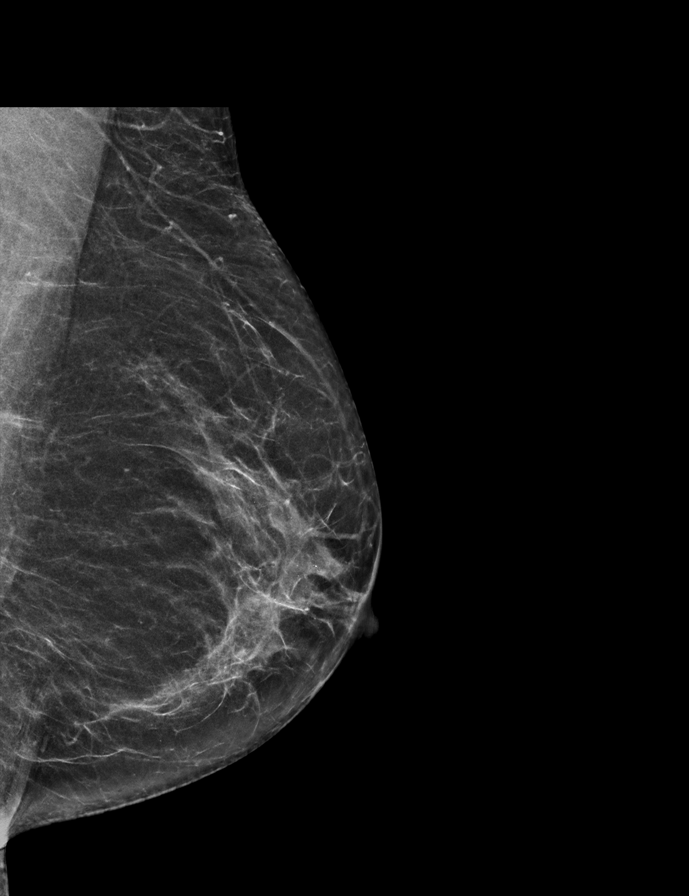

[L CC synth-2D]
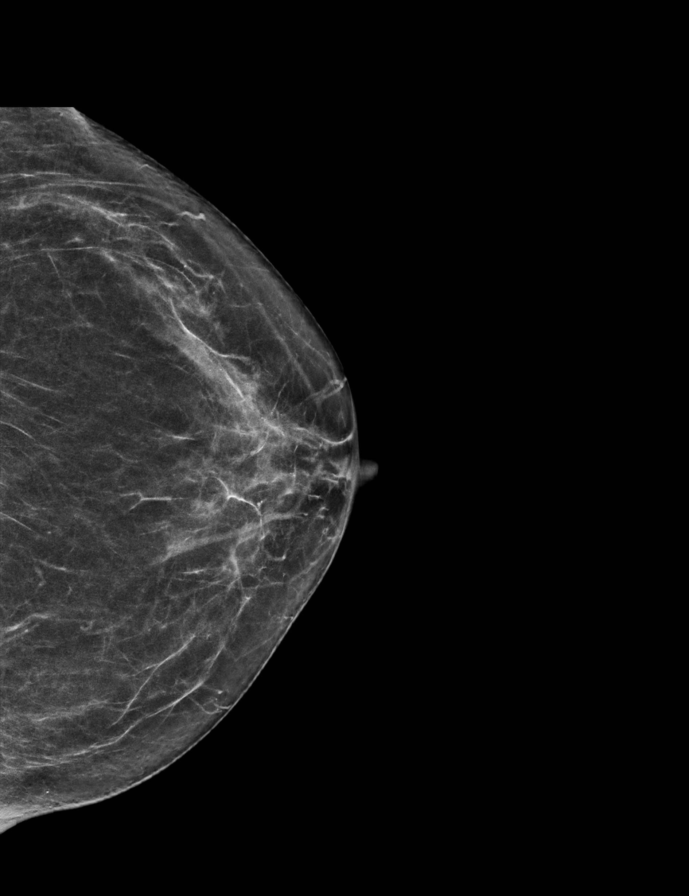

[L MLO tomo · 2 of 58 frames shown]
[frame 19/58]
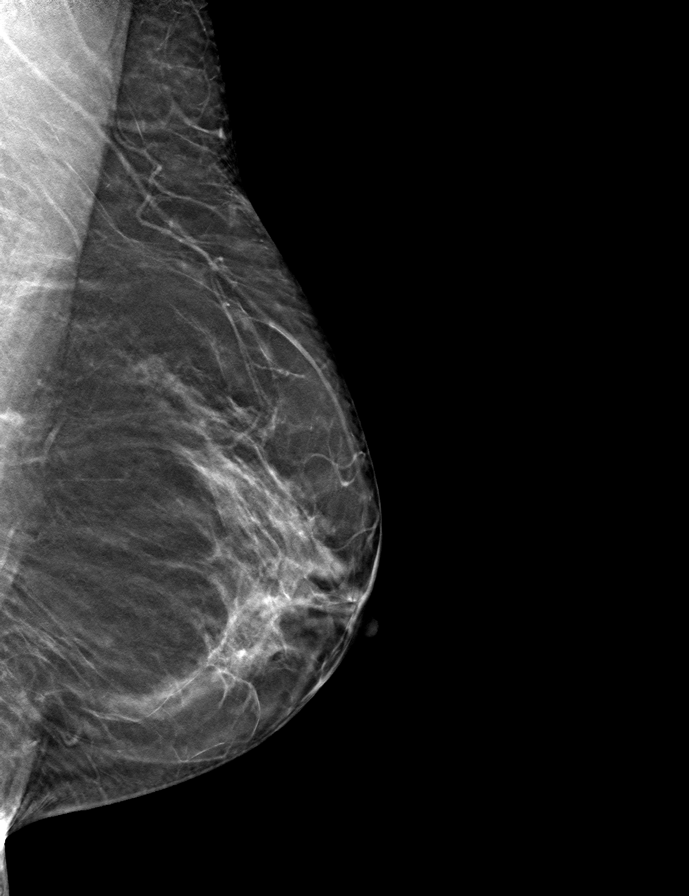
[frame 29/58]
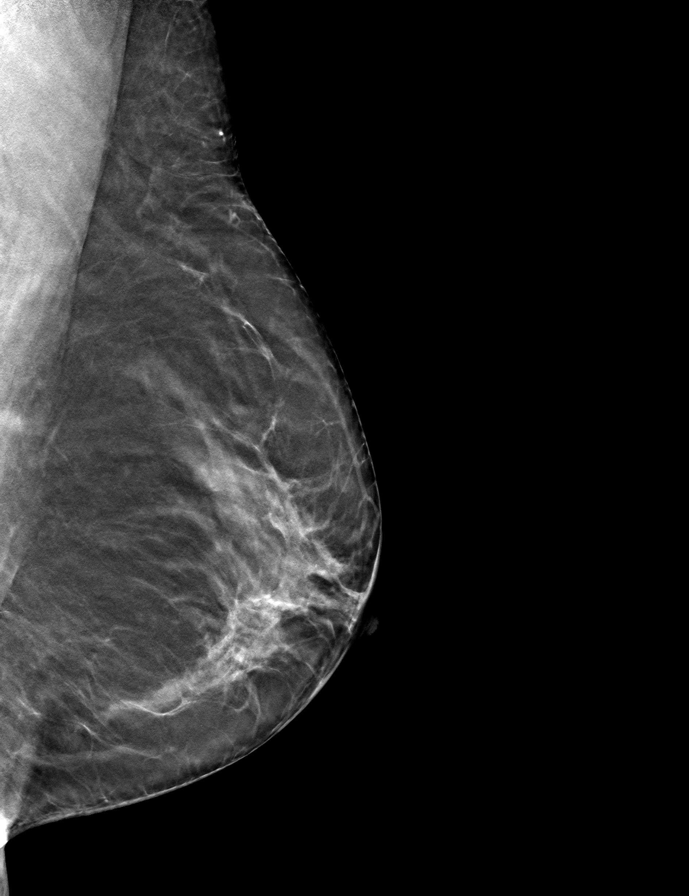

[R MLO tomo · tomo slice 29/57.0]
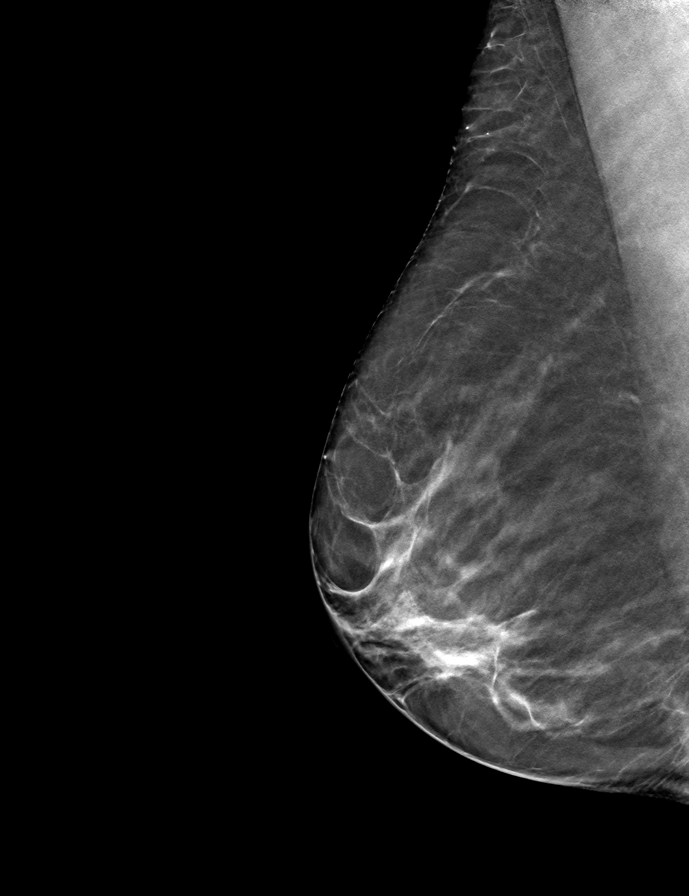

[L CC tomo · tomo slice 28/55.0]
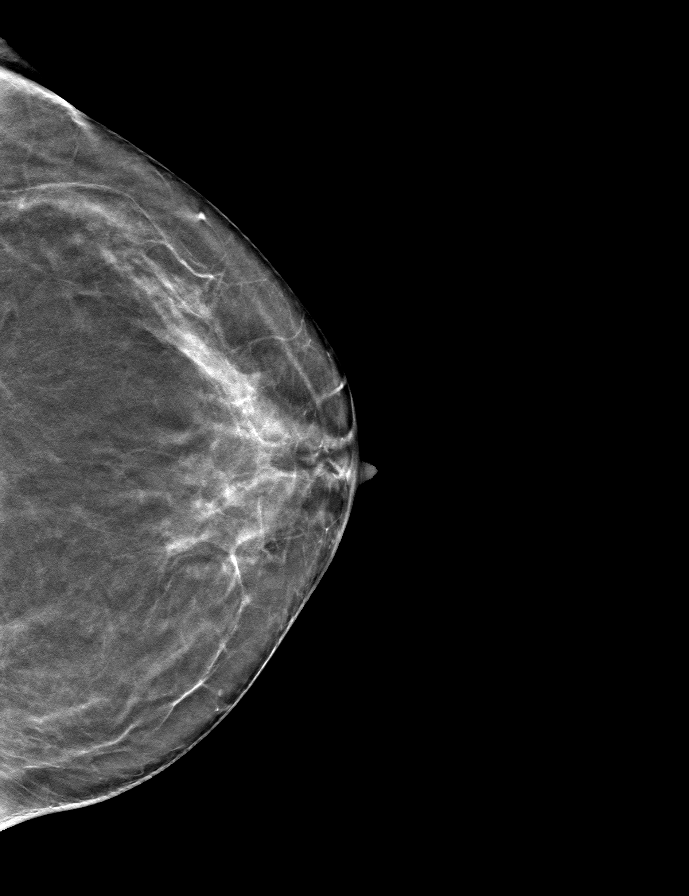

[R CC tomo · tomo slice 29/57.0]
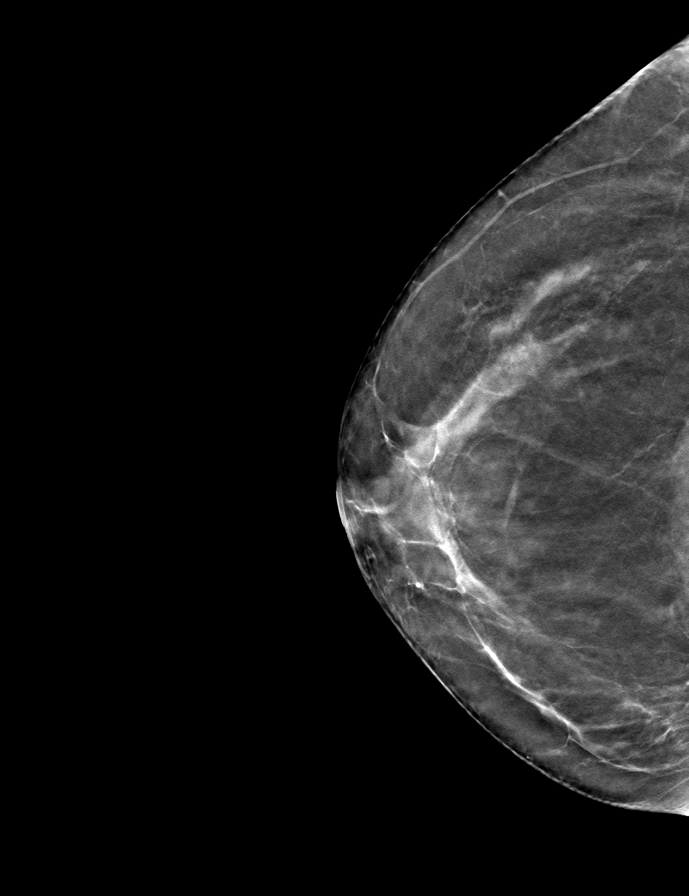

[9 of 24 positions shown; findings below may reference images not displayed]

ACR Breast Density Category b: There are scattered areas of
fibroglandular density.
FINDINGS: In the left breast, a possible mass warrants further evaluation. In
the right breast, no findings suspicious for malignancy.

Images were processed with CAD.
IMPRESSION: Further evaluation is suggested for possible mass in the left
breast.

RECOMMENDATION:
Diagnostic mammogram and possibly ultrasound of the left breast.
(Code:[id number])

The patient will be contacted regarding the findings, and additional
imaging will be scheduled.

BI-RADS CATEGORY  0: Incomplete. Need additional imaging evaluation
and/or prior mammograms for comparison.

## 2020-11-28 DIAGNOSIS — L819 Disorder of pigmentation, unspecified: Secondary | ICD-10-CM | POA: Diagnosis not present

## 2020-11-29 DIAGNOSIS — Z20822 Contact with and (suspected) exposure to covid-19: Secondary | ICD-10-CM | POA: Diagnosis not present

## 2020-12-02 ENCOUNTER — Other Ambulatory Visit: Payer: Self-pay | Admitting: Family Medicine

## 2020-12-02 DIAGNOSIS — D485 Neoplasm of uncertain behavior of skin: Secondary | ICD-10-CM | POA: Diagnosis not present

## 2020-12-02 DIAGNOSIS — L820 Inflamed seborrheic keratosis: Secondary | ICD-10-CM | POA: Diagnosis not present

## 2021-01-05 DIAGNOSIS — Z20822 Contact with and (suspected) exposure to covid-19: Secondary | ICD-10-CM | POA: Diagnosis not present

## 2021-05-04 DIAGNOSIS — R69 Illness, unspecified: Secondary | ICD-10-CM | POA: Diagnosis not present

## 2021-05-11 DIAGNOSIS — R69 Illness, unspecified: Secondary | ICD-10-CM | POA: Diagnosis not present

## 2021-05-19 DIAGNOSIS — R69 Illness, unspecified: Secondary | ICD-10-CM | POA: Diagnosis not present

## 2021-05-30 DIAGNOSIS — R69 Illness, unspecified: Secondary | ICD-10-CM | POA: Diagnosis not present

## 2021-05-31 ENCOUNTER — Other Ambulatory Visit: Payer: Self-pay | Admitting: Family Medicine

## 2021-05-31 DIAGNOSIS — Z1231 Encounter for screening mammogram for malignant neoplasm of breast: Secondary | ICD-10-CM

## 2021-06-09 DIAGNOSIS — R69 Illness, unspecified: Secondary | ICD-10-CM | POA: Diagnosis not present

## 2021-06-22 DIAGNOSIS — R69 Illness, unspecified: Secondary | ICD-10-CM | POA: Diagnosis not present

## 2021-07-05 DIAGNOSIS — R69 Illness, unspecified: Secondary | ICD-10-CM | POA: Diagnosis not present

## 2021-07-06 DIAGNOSIS — R0981 Nasal congestion: Secondary | ICD-10-CM | POA: Diagnosis not present

## 2021-07-06 DIAGNOSIS — U071 COVID-19: Secondary | ICD-10-CM | POA: Diagnosis not present

## 2021-07-06 DIAGNOSIS — R051 Acute cough: Secondary | ICD-10-CM | POA: Diagnosis not present

## 2021-07-06 DIAGNOSIS — R519 Headache, unspecified: Secondary | ICD-10-CM | POA: Diagnosis not present

## 2021-07-21 DIAGNOSIS — R69 Illness, unspecified: Secondary | ICD-10-CM | POA: Diagnosis not present

## 2021-07-25 ENCOUNTER — Ambulatory Visit: Payer: Medicare HMO

## 2021-08-04 DIAGNOSIS — R69 Illness, unspecified: Secondary | ICD-10-CM | POA: Diagnosis not present

## 2021-08-04 DIAGNOSIS — F4322 Adjustment disorder with anxiety: Secondary | ICD-10-CM | POA: Diagnosis not present

## 2021-08-18 DIAGNOSIS — R69 Illness, unspecified: Secondary | ICD-10-CM | POA: Diagnosis not present

## 2021-08-18 DIAGNOSIS — F4322 Adjustment disorder with anxiety: Secondary | ICD-10-CM | POA: Diagnosis not present

## 2021-08-28 DIAGNOSIS — E785 Hyperlipidemia, unspecified: Secondary | ICD-10-CM | POA: Diagnosis not present

## 2021-08-28 DIAGNOSIS — I700 Atherosclerosis of aorta: Secondary | ICD-10-CM | POA: Diagnosis not present

## 2021-08-28 DIAGNOSIS — Z008 Encounter for other general examination: Secondary | ICD-10-CM | POA: Diagnosis not present

## 2021-08-28 DIAGNOSIS — Z91013 Allergy to seafood: Secondary | ICD-10-CM | POA: Diagnosis not present

## 2021-08-28 DIAGNOSIS — I10 Essential (primary) hypertension: Secondary | ICD-10-CM | POA: Diagnosis not present

## 2021-08-28 DIAGNOSIS — J309 Allergic rhinitis, unspecified: Secondary | ICD-10-CM | POA: Diagnosis not present

## 2021-08-28 DIAGNOSIS — I739 Peripheral vascular disease, unspecified: Secondary | ICD-10-CM | POA: Diagnosis not present

## 2021-08-28 DIAGNOSIS — Z87891 Personal history of nicotine dependence: Secondary | ICD-10-CM | POA: Diagnosis not present

## 2021-08-28 DIAGNOSIS — Z7982 Long term (current) use of aspirin: Secondary | ICD-10-CM | POA: Diagnosis not present

## 2021-08-28 DIAGNOSIS — R69 Illness, unspecified: Secondary | ICD-10-CM | POA: Diagnosis not present

## 2021-08-28 DIAGNOSIS — F419 Anxiety disorder, unspecified: Secondary | ICD-10-CM | POA: Diagnosis not present

## 2021-08-31 ENCOUNTER — Other Ambulatory Visit: Payer: Self-pay

## 2021-08-31 ENCOUNTER — Ambulatory Visit
Admission: RE | Admit: 2021-08-31 | Discharge: 2021-08-31 | Disposition: A | Discharge status: Home / Self Care | Payer: Medicare HMO | Source: Ambulatory Visit | Attending: Family Medicine | Admitting: Family Medicine

## 2021-08-31 DIAGNOSIS — Z1231 Encounter for screening mammogram for malignant neoplasm of breast: Secondary | ICD-10-CM | POA: Diagnosis not present

## 2021-09-07 DIAGNOSIS — R69 Illness, unspecified: Secondary | ICD-10-CM | POA: Diagnosis not present

## 2021-09-07 DIAGNOSIS — F4322 Adjustment disorder with anxiety: Secondary | ICD-10-CM | POA: Diagnosis not present

## 2021-09-18 DIAGNOSIS — R69 Illness, unspecified: Secondary | ICD-10-CM | POA: Diagnosis not present

## 2021-09-18 DIAGNOSIS — F4322 Adjustment disorder with anxiety: Secondary | ICD-10-CM | POA: Diagnosis not present

## 2021-10-03 DIAGNOSIS — F4322 Adjustment disorder with anxiety: Secondary | ICD-10-CM | POA: Diagnosis not present

## 2021-10-03 DIAGNOSIS — R69 Illness, unspecified: Secondary | ICD-10-CM | POA: Diagnosis not present

## 2021-10-17 DIAGNOSIS — R69 Illness, unspecified: Secondary | ICD-10-CM | POA: Diagnosis not present

## 2021-10-17 DIAGNOSIS — F4322 Adjustment disorder with anxiety: Secondary | ICD-10-CM | POA: Diagnosis not present

## 2021-10-30 DIAGNOSIS — H5202 Hypermetropia, left eye: Secondary | ICD-10-CM | POA: Diagnosis not present

## 2021-10-30 DIAGNOSIS — H2513 Age-related nuclear cataract, bilateral: Secondary | ICD-10-CM | POA: Diagnosis not present

## 2021-10-30 DIAGNOSIS — H52202 Unspecified astigmatism, left eye: Secondary | ICD-10-CM | POA: Diagnosis not present

## 2021-11-01 DIAGNOSIS — F4322 Adjustment disorder with anxiety: Secondary | ICD-10-CM | POA: Diagnosis not present

## 2021-11-01 DIAGNOSIS — R69 Illness, unspecified: Secondary | ICD-10-CM | POA: Diagnosis not present

## 2021-11-11 DIAGNOSIS — R051 Acute cough: Secondary | ICD-10-CM | POA: Diagnosis not present

## 2021-11-11 DIAGNOSIS — J069 Acute upper respiratory infection, unspecified: Secondary | ICD-10-CM | POA: Diagnosis not present

## 2021-11-11 DIAGNOSIS — Z20828 Contact with and (suspected) exposure to other viral communicable diseases: Secondary | ICD-10-CM | POA: Diagnosis not present

## 2021-12-13 DIAGNOSIS — I10 Essential (primary) hypertension: Secondary | ICD-10-CM | POA: Diagnosis not present

## 2021-12-13 DIAGNOSIS — E7800 Pure hypercholesterolemia, unspecified: Secondary | ICD-10-CM | POA: Diagnosis not present

## 2021-12-13 DIAGNOSIS — Z0000 Encounter for general adult medical examination without abnormal findings: Secondary | ICD-10-CM | POA: Diagnosis not present

## 2021-12-13 DIAGNOSIS — M8580 Other specified disorders of bone density and structure, unspecified site: Secondary | ICD-10-CM | POA: Diagnosis not present

## 2021-12-13 LAB — LAB REPORT - SCANNED: EGFR: 88

## 2021-12-20 DIAGNOSIS — F4322 Adjustment disorder with anxiety: Secondary | ICD-10-CM | POA: Diagnosis not present

## 2021-12-20 DIAGNOSIS — U071 COVID-19: Secondary | ICD-10-CM | POA: Diagnosis not present

## 2022-01-01 DIAGNOSIS — F4322 Adjustment disorder with anxiety: Secondary | ICD-10-CM | POA: Diagnosis not present

## 2022-01-15 DIAGNOSIS — F4322 Adjustment disorder with anxiety: Secondary | ICD-10-CM | POA: Diagnosis not present

## 2022-01-24 DIAGNOSIS — F4322 Adjustment disorder with anxiety: Secondary | ICD-10-CM | POA: Diagnosis not present

## 2022-02-05 DIAGNOSIS — F4322 Adjustment disorder with anxiety: Secondary | ICD-10-CM | POA: Diagnosis not present

## 2022-02-19 DIAGNOSIS — F4322 Adjustment disorder with anxiety: Secondary | ICD-10-CM | POA: Diagnosis not present

## 2022-03-07 DIAGNOSIS — F4322 Adjustment disorder with anxiety: Secondary | ICD-10-CM | POA: Diagnosis not present

## 2022-03-20 DIAGNOSIS — L404 Guttate psoriasis: Secondary | ICD-10-CM | POA: Diagnosis not present

## 2022-03-20 DIAGNOSIS — J020 Streptococcal pharyngitis: Secondary | ICD-10-CM | POA: Diagnosis not present

## 2022-03-20 DIAGNOSIS — Z79899 Other long term (current) drug therapy: Secondary | ICD-10-CM | POA: Diagnosis not present

## 2022-07-18 ENCOUNTER — Other Ambulatory Visit: Payer: Self-pay | Admitting: Family Medicine

## 2022-07-18 DIAGNOSIS — Z1231 Encounter for screening mammogram for malignant neoplasm of breast: Secondary | ICD-10-CM

## 2022-07-20 DIAGNOSIS — Z79899 Other long term (current) drug therapy: Secondary | ICD-10-CM | POA: Diagnosis not present

## 2022-07-20 DIAGNOSIS — L400 Psoriasis vulgaris: Secondary | ICD-10-CM | POA: Diagnosis not present

## 2022-07-24 DIAGNOSIS — Z79899 Other long term (current) drug therapy: Secondary | ICD-10-CM | POA: Diagnosis not present

## 2022-07-24 DIAGNOSIS — L400 Psoriasis vulgaris: Secondary | ICD-10-CM | POA: Diagnosis not present

## 2022-08-06 DIAGNOSIS — L400 Psoriasis vulgaris: Secondary | ICD-10-CM | POA: Diagnosis not present

## 2022-08-08 DIAGNOSIS — L400 Psoriasis vulgaris: Secondary | ICD-10-CM | POA: Diagnosis not present

## 2022-08-10 DIAGNOSIS — L400 Psoriasis vulgaris: Secondary | ICD-10-CM | POA: Diagnosis not present

## 2022-08-13 DIAGNOSIS — L400 Psoriasis vulgaris: Secondary | ICD-10-CM | POA: Diagnosis not present

## 2022-08-15 DIAGNOSIS — L400 Psoriasis vulgaris: Secondary | ICD-10-CM | POA: Diagnosis not present

## 2022-08-17 DIAGNOSIS — L400 Psoriasis vulgaris: Secondary | ICD-10-CM | POA: Diagnosis not present

## 2022-08-22 DIAGNOSIS — L400 Psoriasis vulgaris: Secondary | ICD-10-CM | POA: Diagnosis not present

## 2022-08-24 DIAGNOSIS — L400 Psoriasis vulgaris: Secondary | ICD-10-CM | POA: Diagnosis not present

## 2022-08-27 DIAGNOSIS — L400 Psoriasis vulgaris: Secondary | ICD-10-CM | POA: Diagnosis not present

## 2022-08-29 DIAGNOSIS — L400 Psoriasis vulgaris: Secondary | ICD-10-CM | POA: Diagnosis not present

## 2022-09-03 ENCOUNTER — Ambulatory Visit (INDEPENDENT_AMBULATORY_CARE_PROVIDER_SITE_OTHER): Payer: Medicare Other | Admitting: Family Medicine

## 2022-09-03 ENCOUNTER — Encounter: Payer: Self-pay | Admitting: Family Medicine

## 2022-09-03 DIAGNOSIS — I10 Essential (primary) hypertension: Secondary | ICD-10-CM | POA: Diagnosis not present

## 2022-09-03 DIAGNOSIS — E785 Hyperlipidemia, unspecified: Secondary | ICD-10-CM | POA: Diagnosis not present

## 2022-09-03 DIAGNOSIS — F419 Anxiety disorder, unspecified: Secondary | ICD-10-CM | POA: Diagnosis not present

## 2022-09-03 DIAGNOSIS — J45909 Unspecified asthma, uncomplicated: Secondary | ICD-10-CM | POA: Insufficient documentation

## 2022-09-03 DIAGNOSIS — L409 Psoriasis, unspecified: Secondary | ICD-10-CM | POA: Diagnosis not present

## 2022-09-03 MED ORDER — ALPRAZOLAM 0.5 MG PO TABS: 0.2500 mg | ORAL_TABLET | Freq: Two times a day (BID) | ORAL | 5 refills | Status: DC | PRN

## 2022-09-03 NOTE — Assessment & Plan Note (Signed)
Stable on albuterol as needed.  Infrequently.  Does not need refill today.

## 2022-09-03 NOTE — Patient Instructions (Signed)
It was very nice to see you today!  I will refill your medications today.  We will get records from your previous doctor.  Please continue to work on diet and exercise.  I will see back in about 6 months or so for your annual physical plans.  Please come back to see me sooner as needed.  Take care, Dr Jerline Pain  PLEASE NOTE:  If you had any lab tests please let us know if you have not heard back within a few days. You may see your results on mychart before we have a chance to review them but we will give you a call once they are reviewed by [place]. If we ordered any referrals today, please let us know if you have not heard from their office within the next week.   Please try these tips to maintain a healthy lifestyle:  Eat at least 3 REAL meals and 1-2 snacks per day.  Aim for no more than 5 hours between eating.  If you eat breakfast, please do so within one hour of getting up.   Each meal should contain half fruits/vegetables, one quarter protein, and one quarter carbs (no bigger than a computer mouse)  Cut down on sweet beverages. This includes juice, soda, and sweet tea.   Drink at least 1 glass of water with each meal and aim for at least 8 glasses per day  Exercise at least 150 minutes every week.

## 2022-09-03 NOTE — Assessment & Plan Note (Signed)
Follows with dermatology.  Will be starting Skyrizi soon.  She is also on aciretin and calcipotriene and triamcinolone.

## 2022-09-03 NOTE — Assessment & Plan Note (Signed)
Had lengthy discussion with patient regarding her stress and anxiety.  Has a lot of stress at home related to health of her children.  Currently living with her.  She uses Xanax as needed.  This helps with sleep as well.  She has done this for several years without any significant side effects.  Database was reviewed without red flags.  We will refill her Xanax today.  She has seen therapy in the past.  We discussed referral to see a therapist again how she declined.  At some point may benefit from starting SSRI but will defer for today.  We can readdress at next office visit.

## 2022-09-03 NOTE — Assessment & Plan Note (Signed)
Stable on Crestor 5 mg every other day.  She will come back in about 6 months for CPE and we can recheck lipids at that time.

## 2022-09-03 NOTE — Progress Notes (Signed)
Morgan Carter is a 76 y.o. female who presents today for an office visit.  Assessment/Plan:  Chronic Problems Addressed Today: Anxiety Had lengthy discussion with patient regarding her stress and anxiety.  Has a lot of stress at home related to health of her children.  Currently living with her.  She uses Xanax as needed.  This helps with sleep as well.  She has done this for several years without any significant side effects.  Database was reviewed without red flags.  We will refill her Xanax today.  She has seen therapy in the past.  We discussed referral to see a therapist again how she declined.  At some point may benefit from starting SSRI but will defer for today.  We can readdress at next office visit.  Reactive airway disease Stable on albuterol as needed.  Infrequently.  Does not need refill today.  Dyslipidemia Stable on Crestor 5 mg every other day.  She will come back in about 6 months for CPE and we can recheck lipids at that time.  Essential hypertension Elevated today.  Typically well controlled.  We will continue lisinopril 20 mg daily.  She will continue to monitor at home and let us know if persistently elevated.  Psoriasis Follows with dermatology.  Will be starting Skyrizi soon.  She is also on aciretin and calcipotriene and triamcinolone.     Subjective:  HPI:  See A/p for status of chronic conditions.  ROS: Per HPI, otherwise a complete review of systems was negative.   PMH:  The following were reviewed and entered/updated in epic: Past Medical History:  Diagnosis Date   Hyperlipidemia    Hypertension    Patient Active Problem List   Diagnosis Date Noted   Psoriasis 09/03/2022   Essential hypertension 09/03/2022   Dyslipidemia 09/03/2022   Anxiety 09/03/2022   Reactive airway disease 09/03/2022   Past Surgical History:  Procedure Laterality Date   BREAST BIOPSY Left 1998   benign   BREAST BIOPSY Right 1992   benign   BREAST BIOPSY  Right 1991   benign   BREAST BIOPSY Right 1990   benign    Family History  Problem Relation Age of Onset   Heart disease Mother    Asthma Mother    Hypertension Mother    Early death Father     Medications- reviewed and updated Current Outpatient Medications  Medication Sig Dispense Refill   acitretin (SORIATANE) 10 MG capsule Take 10 mg by mouth daily.     albuterol (VENTOLIN HFA) 108 (90 Base) MCG/ACT inhaler SMARTSIG:1 Puff(s) Via Inhaler Every 4 Hours PRN     aspirin 81 MG chewable tablet Chew by mouth daily.     calcipotriene (DOVONOX) 0.005 % cream Apply topically daily.     calcium carbonate (OSCAL) 1500 (600 Ca) MG TABS tablet Take by mouth 2 (two) times daily with a meal.     lisinopril (ZESTRIL) 20 MG tablet Take 20 mg by mouth daily.     rosuvastatin (CRESTOR) 5 MG tablet Take 5 mg by mouth every other day.     SKYRIZI PEN 150 MG/ML SOAJ Inject into the skin.     triamcinolone cream (KENALOG) 0.1 % Apply topically 2 (two) times daily.     zinc gluconate 50 MG tablet Take 50 mg by mouth daily.     ALPRAZolam (XANAX) 0.5 MG tablet Take 0.5-1 tablets (0.25-0.5 mg total) by mouth 2 (two) times daily as needed for anxiety. 30 tablet 5  No current facility-administered medications for this visit.    Allergies-reviewed and updated No Known Allergies  Social History   Socioeconomic History   Marital status: Single    Spouse name: Not on file   Number of children: Not on file   Years of education: Not on file   Highest education level: Not on file  Occupational History   Not on file  Tobacco Use   Smoking status: Former    Types: Cigarettes   Smokeless tobacco: Never  Substance and Sexual Activity   Alcohol use: Not Currently   Drug use: Never   Sexual activity: Not on file  Other Topics Concern   Not on file  Social History Narrative   Not on file   Social Determinants of Health   Financial Resource Strain: Not on file  Food Insecurity: Not on file   Transportation Needs: Not on file  Physical Activity: Not on file  Stress: Not on file  Social Connections: Not on file            Objective:  Physical Exam: BP (!) 168/81   Pulse 64   Temp 98 F (36.7 C) (Temporal)   Ht 5' 4.5" (1.638 m)   Wt 136 lb 6.4 oz (61.9 kg)   SpO2 98%   BMI 23.05 kg/m   Gen: No acute distress, resting comfortably CV: Regular rate and rhythm with no murmurs appreciated Pulm: Normal work of breathing, clear to auscultation bilaterally with no crackles, wheezes, or rhonchi Neuro: Grossly normal, moves all extremities Psych: Normal affect and thought content      Coline Calkin M. Jerline Pain, MD 09/03/2022 10:37 AM

## 2022-09-03 NOTE — Assessment & Plan Note (Signed)
Elevated today.  Typically well controlled.  We will continue lisinopril 20 mg daily.  She will continue to monitor at home and let us know if persistently elevated.

## 2022-09-04 ENCOUNTER — Ambulatory Visit
Admission: RE | Admit: 2022-09-04 | Discharge: 2022-09-04 | Disposition: A | Discharge status: Home / Self Care | Payer: Self-pay | Source: Ambulatory Visit | Attending: Family Medicine | Admitting: Family Medicine

## 2022-09-04 DIAGNOSIS — Z1231 Encounter for screening mammogram for malignant neoplasm of breast: Secondary | ICD-10-CM | POA: Diagnosis not present

## 2022-09-10 ENCOUNTER — Ambulatory Visit (INDEPENDENT_AMBULATORY_CARE_PROVIDER_SITE_OTHER): Payer: Medicare Other | Admitting: Family Medicine

## 2022-09-10 ENCOUNTER — Encounter: Payer: Self-pay | Admitting: Family Medicine

## 2022-09-10 VITALS — BP 130/68 | HR 88 | Temp 98.7°F | Ht 64.5 in | Wt 136.0 lb

## 2022-09-10 DIAGNOSIS — J45901 Unspecified asthma with (acute) exacerbation: Secondary | ICD-10-CM | POA: Diagnosis not present

## 2022-09-10 DIAGNOSIS — B9689 Other specified bacterial agents as the cause of diseases classified elsewhere: Secondary | ICD-10-CM

## 2022-09-10 DIAGNOSIS — J208 Acute bronchitis due to other specified organisms: Secondary | ICD-10-CM

## 2022-09-10 LAB — POC COVID19 BINAXNOW: SARS Coronavirus 2 Ag: NEGATIVE

## 2022-09-10 MED ORDER — AZITHROMYCIN 250 MG PO TABS: ORAL_TABLET | ORAL | 0 refills | Status: DC

## 2022-09-10 MED ORDER — GUAIFENESIN-CODEINE 100-10 MG/5ML PO SOLN: 5.0000 mL | Freq: Four times a day (QID) | ORAL | 0 refills | Status: DC | PRN

## 2022-09-10 MED ORDER — PREDNISONE 10 MG PO TABS: ORAL_TABLET | ORAL | 0 refills | Status: DC

## 2022-09-10 NOTE — Progress Notes (Signed)
Subjective  CC:  Chief Complaint  Patient presents with   Cough    Pt stated that she has been coughing for the past 4 days. Tested Neg last night with COVID test.    HPI: SUBJECTIVE:  Same day acute visit; PCP not available. New pt to me. Chart reviewed.   Morgan Carter is a 76 y.o. female who complains of congestion, nasal blockage, post nasal drip, cough described as barky, harsh, and productive and has had wheezing and associated fevers. Sxs started about 5 days ago. Covid neg last night. Wheezing responsive to albuterol.  No pleuritic chest pain. She denies a history of anorexia, dizziness, vomiting and abdominal pain. Patient does not smoke cigarettes. She has asthma mild intermittent. She admits to getting winded with walking when wheezing.   Assessment  1. Acute bacterial bronchitis   2. Reactive airway disease with acute exacerbation, unspecified asthma severity, unspecified whether persistent      Plan  Discussion:  Treat for bacterial bronchitis due to prolonged course and worsening symptoms. Cover for bronchospasm with albuterol and pred taper and treat cough with rob AC. Education regarding differences between viral and bacterial infections and treatment options are discussed.  Supportive care measures are recommended.  We discussed the use of mucolytic's, decongestants, antihistamines and antitussives as needed.  Tylenol or Advil are recommended if needed.  Follow up: as needed   Orders Placed This Encounter  Procedures   POC COVID-19   Meds ordered this encounter  Medications   azithromycin (ZITHROMAX) 250 MG tablet    Sig: Take 2 tabs today, then 1 tab daily for 4 days    Dispense:  1 each    Refill:  0   predniSONE (DELTASONE) 10 MG tablet    Sig: Take 4 tabs qd x 2 days, 3 qd x 2 days, 2 qd x 2d, 1qd x 3 days    Dispense:  21 tablet    Refill:  0   guaiFENesin-codeine 100-10 MG/5ML syrup    Sig: Take 5 mLs by mouth every 6 (six) hours as needed for  cough.    Dispense:  120 mL    Refill:  0      I reviewed the patients updated PMH, FH, and SocHx.  Social History: Patient  reports that she has quit smoking. Her smoking use included cigarettes. She has never used smokeless tobacco. She reports that she does not currently use alcohol. She reports that she does not use drugs.  Patient Active Problem List   Diagnosis Date Noted   Psoriasis 09/03/2022   Essential hypertension 09/03/2022   Dyslipidemia 09/03/2022   Anxiety 09/03/2022   Reactive airway disease 09/03/2022    Review of Systems: Cardiovascular: negative for chest pain Respiratory: negative for SOB or hemoptysis Gastrointestinal: negative for abdominal pain Genitourinary: negative for dysuria or gross hematuria Current Meds  Medication Sig   acitretin (SORIATANE) 10 MG capsule Take 10 mg by mouth daily.   albuterol (VENTOLIN HFA) 108 (90 Base) MCG/ACT inhaler SMARTSIG:1 Puff(s) Via Inhaler Every 4 Hours PRN   ALPRAZolam (XANAX) 0.5 MG tablet Take 0.5-1 tablets (0.25-0.5 mg total) by mouth 2 (two) times daily as needed for anxiety.   aspirin 81 MG chewable tablet Chew by mouth daily.   azithromycin (ZITHROMAX) 250 MG tablet Take 2 tabs today, then 1 tab daily for 4 days   B Complex-C (B-COMPLEX WITH VITAMIN C) tablet Take 1 tablet by mouth daily.   calcipotriene (DOVONOX) 0.005 % cream  Apply topically daily.   calcium carbonate (OSCAL) 1500 (600 Ca) MG TABS tablet Take by mouth 2 (two) times daily with a meal.   guaiFENesin-codeine 100-10 MG/5ML syrup Take 5 mLs by mouth every 6 (six) hours as needed for cough.   lisinopril (ZESTRIL) 20 MG tablet Take 20 mg by mouth daily.   Multiple Vitamins-Minerals (MULTIVITAMIN WITH MINERALS) tablet Take 1 tablet by mouth daily.   predniSONE (DELTASONE) 10 MG tablet Take 4 tabs qd x 2 days, 3 qd x 2 days, 2 qd x 2d, 1qd x 3 days   rosuvastatin (CRESTOR) 5 MG tablet Take 5 mg by mouth every other day.   triamcinolone cream (KENALOG)  0.1 % Apply topically 2 (two) times daily.   zinc gluconate 50 MG tablet Take 50 mg by mouth daily.    Objective  Vitals: BP 130/68   Pulse 88   Temp 98.7 F (37.1 C)   Ht 5' 4.5" (1.638 m)   Wt 136 lb (61.7 kg)   SpO2 96%   BMI 22.98 kg/m  General: no acute distress but barking cough w/o respiratory distress Psych:  Alert and oriented, normal mood and affect HEENT:  Normocephalic, atraumatic, supple neck,  mild lymphadenopathy, supple neck Cardiovascular:  RRR without murmur. no edema Respiratory:  Good breath sounds bilaterally, CTAB with normal respiratory effort with occasional rhonchi Skin:  Warm, no rashes Neurologic:   Mental status is normal. normal gait  Covid negative in office today.  Commons side effects, risks, benefits, and alternatives for medications and treatment plan prescribed today were discussed, and the patient expressed understanding of the given instructions. Patient is instructed to call or message via MyChart if he/she has any questions or concerns regarding our treatment plan. No barriers to understanding were identified. We discussed Red Flag symptoms and signs in detail. Patient expressed understanding regarding what to do in case of urgent or emergency type symptoms.  Medication list was reconciled, printed and provided to the patient in AVS. Patient instructions and summary information was reviewed with the patient as documented in the AVS. This note was prepared with assistance of Dragon voice recognition software. Occasional wrong-word or sound-a-like substitutions may have occurred due to the inherent limitations of voice recognition software

## 2022-09-10 NOTE — Patient Instructions (Signed)
Please follow up if symptoms do not improve or as needed.   Use your albuterol every 4-6 hours for cough or wheeze. Treat your fevers. Complete the antibiotics and prednisone as directed and the cough syrup is for nighttime cough. Mucinex DM can be used for daytime cough.  Return if worsening.   Acute Bronchitis, Adult  Acute bronchitis is sudden inflammation of the main airways (bronchi) that come off the windpipe (trachea) in the lungs. The swelling causes the airways to get smaller and make more mucus than normal. This can make it hard to breathe and can cause coughing or noisy breathing (wheezing). Acute bronchitis may last several weeks. The cough may last longer. Allergies, asthma, and exposure to smoke may make the condition worse. What are the causes? This condition can be caused by germs and by substances that irritate the lungs, including: Cold and flu viruses. The most common cause of this condition is the virus that causes the common cold. Bacteria. This is less common. Breathing in substances that irritate the lungs, including: Smoke from cigarettes and other forms of tobacco. Dust and pollen. Fumes from household cleaning products, gases, or burned fuel. Indoor or outdoor air pollution. What increases the risk? The following factors may make you more likely to develop this condition: A weak body's defense system, also called the immune system. A condition that affects your lungs and breathing, such as asthma. What are the signs or symptoms? Common symptoms of this condition include: Coughing. This may bring up clear, yellow, or green mucus from your lungs (sputum). Wheezing. Runny or stuffy nose. Having too much mucus in your lungs (chest congestion). Shortness of breath. Aches and pains, including sore throat or chest. How is this diagnosed? This condition is usually diagnosed based on: Your symptoms and medical history. A physical exam. You may also have other tests,  including tests to rule out other conditions, such as pneumonia. These tests include: A test of lung function. Test of a mucus sample to look for the presence of bacteria. Tests to check the oxygen level in your blood. Blood tests. Chest X-ray. How is this treated? Most cases of acute bronchitis clear up over time without treatment. Your health care provider may recommend: Drinking more fluids to help thin your mucus so it is easier to cough up. Taking inhaled medicine (inhaler) to improve air flow in and out of your lungs. Using a vaporizer or a humidifier. These are machines that add water to the air to help you breathe better. Taking a medicine that thins mucus and clears congestion (expectorant). Taking a medicine that prevents or stops coughing (cough suppressant). It is not common to take an antibiotic medicine for this condition. Follow these instructions at home:  Take over-the-counter and prescription medicines only as told by your health care provider. Use an inhaler, vaporizer, or humidifier as told by your health care provider. Take two teaspoons (10 mL) of honey at bedtime to lessen coughing at night. Drink enough fluid to keep your urine pale yellow. Do not use any products that contain nicotine or tobacco. These products include cigarettes, chewing tobacco, and vaping devices, such as e-cigarettes. If you need help quitting, ask your health care provider. Get plenty of rest. Return to your normal activities as told by your health care provider. Ask your health care provider what activities are safe for you. Keep all follow-up visits. This is important. How is this prevented? To lower your risk of getting this condition again: [place] your  hands often with soap and water for at least 20 seconds. If soap and water are not available, use hand sanitizer. Avoid contact with people who have cold symptoms. Try not to touch your mouth, nose, or eyes with your hands. Avoid breathing  in smoke or chemical fumes. Breathing smoke or chemical fumes will make your condition worse. Get the flu shot every year. Contact a health care provider if: Your symptoms do not improve after 2 weeks. You have trouble coughing up the mucus. Your cough keeps you awake at night. You have a fever. Get help right away if you: Cough up blood. Feel pain in your chest. Have severe shortness of breath. Faint or keep feeling like you are going to faint. Have a severe headache. Have a fever or chills that get worse. These symptoms may represent a serious problem that is an emergency. Do not wait to see if the symptoms will go away. Get medical help right away. Call your local emergency services (911 in the [place] Do not drive yourself to the hospital. Summary Acute bronchitis is inflammation of the main airways (bronchi) that come off the windpipe (trachea) in the lungs. The swelling causes the airways to get smaller and make more mucus than normal. Drinking more fluids can help thin your mucus so it is easier to cough up. Take over-the-counter and prescription medicines only as told by your health care provider. Do not use any products that contain nicotine or tobacco. These products include cigarettes, chewing tobacco, and vaping devices, such as e-cigarettes. If you need help quitting, ask your health care provider. Contact a health care provider if your symptoms do not improve after 2 weeks. This information is not intended to replace advice given to you by your health care provider. Make sure you discuss any questions you have with your health care provider. Document Revised: 02/22/2022 Document Reviewed: 03/15/2021 Elsevier Patient Education  [place].

## 2022-09-26 ENCOUNTER — Ambulatory Visit
Admission: RE | Admit: 2022-09-26 | Discharge: 2022-09-26 | Disposition: A | Discharge status: Home / Self Care | Payer: Medicare Other | Source: Ambulatory Visit | Attending: Family Medicine | Admitting: Family Medicine

## 2022-09-26 ENCOUNTER — Other Ambulatory Visit: Payer: Self-pay | Admitting: Family Medicine

## 2022-09-26 DIAGNOSIS — M25571 Pain in right ankle and joints of right foot: Secondary | ICD-10-CM

## 2022-09-26 DIAGNOSIS — M7989 Other specified soft tissue disorders: Secondary | ICD-10-CM | POA: Diagnosis not present

## 2022-09-26 DIAGNOSIS — M25471 Effusion, right ankle: Secondary | ICD-10-CM

## 2022-09-26 DIAGNOSIS — S92351A Displaced fracture of fifth metatarsal bone, right foot, initial encounter for closed fracture: Secondary | ICD-10-CM | POA: Diagnosis not present

## 2022-09-28 DIAGNOSIS — S92301A Fracture of unspecified metatarsal bone(s), right foot, initial encounter for closed fracture: Secondary | ICD-10-CM | POA: Diagnosis not present

## 2022-10-04 DIAGNOSIS — L400 Psoriasis vulgaris: Secondary | ICD-10-CM | POA: Diagnosis not present

## 2022-10-04 DIAGNOSIS — Z79899 Other long term (current) drug therapy: Secondary | ICD-10-CM | POA: Diagnosis not present

## 2022-10-09 DIAGNOSIS — Z79899 Other long term (current) drug therapy: Secondary | ICD-10-CM | POA: Diagnosis not present

## 2022-10-09 DIAGNOSIS — L400 Psoriasis vulgaris: Secondary | ICD-10-CM | POA: Diagnosis not present

## 2022-10-23 ENCOUNTER — Ambulatory Visit (INDEPENDENT_AMBULATORY_CARE_PROVIDER_SITE_OTHER): Payer: Medicare Other

## 2022-10-23 VITALS — Wt 136.0 lb

## 2022-10-23 DIAGNOSIS — Z0000 Encounter for general adult medical examination without abnormal findings: Secondary | ICD-10-CM | POA: Diagnosis not present

## 2022-10-23 NOTE — Progress Notes (Signed)
I connected with  Morgan Carter on 10/23/22 by a audio enabled telemedicine application and verified that I am speaking with the correct person using two identifiers.  Patient Location: Home  Provider Location: Office/Clinic  I discussed the limitations of evaluation and management by telemedicine. The patient expressed understanding and agreed to proceed.   Subjective:   Morgan Carter is a 76 y.o. female who presents for an Initial Medicare Annual Wellness Visit.  Review of Systems     Cardiac Risk Factors include: advanced age (>49mn, >>29women);dyslipidemia;hypertension     Objective:    Today's Vitals   10/23/22 1043  Weight: 136 lb (61.7 kg)   Body mass index is 22.98 kg/m.     10/23/2022   10:55 AM 12/14/2017    8:51 PM  Advanced Directives  Does Patient Have a Medical Advance Directive? Yes No  Type of [place]   [place]? No - copy requested   Would patient like information on creating a medical advance directive?  No - Patient declined    Current Medications (verified) Outpatient Encounter Medications as of 10/23/2022  Medication Sig   acitretin (SORIATANE) 10 MG capsule Take 10 mg by mouth daily.   albuterol (VENTOLIN HFA) 108 (90 Base) MCG/ACT inhaler SMARTSIG:1 Puff(s) Via Inhaler Every 4 Hours PRN   ALPRAZolam (XANAX) 0.5 MG tablet Take 0.5-1 tablets (0.25-0.5 mg total) by mouth 2 (two) times daily as needed for anxiety.   aspirin 81 MG chewable tablet Chew by mouth daily.   B Complex-C (B-COMPLEX WITH VITAMIN C) tablet Take 1 tablet by mouth daily.   calcipotriene (DOVONOX) 0.005 % cream Apply topically daily.   calcium carbonate (OSCAL) 1500 (600 Ca) MG TABS tablet Take by mouth 2 (two) times daily with a meal.   cetirizine (ZYRTEC) 10 MG tablet Take 10 mg by mouth daily.   Cholecalciferol (VITAMIN D3) 75 MCG (3000 UT) TABS Take by mouth.   lisinopril  (ZESTRIL) 20 MG tablet Take 20 mg by mouth daily.   Multiple Vitamins-Minerals (MULTIVITAMIN WITH MINERALS) tablet Take 1 tablet by mouth daily.   rosuvastatin (CRESTOR) 5 MG tablet Take 5 mg by mouth every other day.   triamcinolone cream (KENALOG) 0.1 % Apply topically 2 (two) times daily.   zinc gluconate 50 MG tablet Take 50 mg by mouth daily.   guaiFENesin-codeine 100-10 MG/5ML syrup Take 5 mLs by mouth every 6 (six) hours as needed for cough. (Patient not taking: Reported on 10/23/2022)   [DISCONTINUED] azithromycin (ZITHROMAX) 250 MG tablet Take 2 tabs today, then 1 tab daily for 4 days   [DISCONTINUED] predniSONE (DELTASONE) 10 MG tablet Take 4 tabs qd x 2 days, 3 qd x 2 days, 2 qd x 2d, 1qd x 3 days   [DISCONTINUED] SKYRIZI PEN 150 MG/ML SOAJ Inject into the skin. (Patient not taking: Reported on 09/10/2022)   No facility-administered encounter medications on file as of 10/23/2022.    Allergies (verified) Shrimp (diagnostic) and Cats claw (uncaria tomentosa)   History: Past Medical History:  Diagnosis Date   Hyperlipidemia    Hypertension    Past Surgical History:  Procedure Laterality Date   BREAST BIOPSY Left 1998   benign   BREAST BIOPSY Right 1992   benign   BREAST BIOPSY Right 1991   benign   BREAST BIOPSY Right 1990   benign   CESAREAN SECTION  11/10/1984   KNEE SURGERY  1992  Family History  Problem Relation Age of Onset   COPD Mother    Heart disease Mother    Asthma Mother    Hypertension Mother    Early death Father    Drug abuse Sister    Depression Sister    COPD Sister    Asthma Sister    Arthritis Sister    Alcohol abuse Sister    Hearing loss Sister    Asthma Sister    Drug abuse Daughter    Depression Daughter    Alcohol abuse Daughter    Hypertension Son    Hyperlipidemia Son    Drug abuse Son    Depression Son    Alcohol abuse Son    Early death Maternal Grandmother    Diabetes Maternal Grandfather    Alcohol abuse Paternal  Grandmother    Diabetes Paternal Grandmother    Hearing loss Paternal Grandfather    Social History   Socioeconomic History   Marital status: Single    Spouse name: Not on file   Number of children: Not on file   Years of education: Not on file   Highest education level: Not on file  Occupational History   Not on file  Tobacco Use   Smoking status: Former    Types: Cigarettes   Smokeless tobacco: Never  Substance and Sexual Activity   Alcohol use: Not Currently   Drug use: Never   Sexual activity: Not on file  Other Topics Concern   Not on file  Social History Narrative   Not on file   Social Determinants of Health   Financial Resource Strain: Low Risk  (10/23/2022)   Overall Financial Resource Strain (CARDIA)    Difficulty of Paying Living Expenses: Not hard at all  Food Insecurity: No Food Insecurity (10/23/2022)   Hunger Vital Sign    Worried About Running Out of Food in the Last Year: Never true    Ran Out of Food in the Last Year: Never true  Transportation Needs: No Transportation Needs (10/23/2022)   PRAPARE - Hydrologist (Medical): No    Lack of Transportation (Non-Medical): No  Physical Activity: Inactive (10/23/2022)   Exercise Vital Sign    Days of Exercise per Week: 0 days    Minutes of Exercise per Session: 0 min  Stress: Stress Concern Present (10/23/2022)   [place]    Feeling of Stress : To some extent  Social Connections: Moderately Integrated (10/23/2022)   Social Connection and Isolation Panel [NHANES]    Frequency of Communication with Friends and Family: More than three times a week    Frequency of Social Gatherings with Friends and Family: More than three times a week    Attends Religious Services: 1 to 4 times per year    Active Member of [employer] or Organizations: Yes    Attends Archivist Meetings: 1 to 4 times per year    Marital  Status: Divorced    Tobacco Counseling Counseling given: Not Answered   Clinical Intake:  Pre-visit preparation completed: Yes  Pain : No/denies pain     BMI - recorded: 22.98 Nutritional Status: BMI of 19-24  Normal Nutritional Risks: None Diabetes: No  How often do you need to have someone help you when you read instructions, pamphlets, or other written materials from your doctor or pharmacy?: 1 - Never  Diabetic?no  Interpreter Needed?: No  Information entered by :: Otila Kluver  Geraldene Eisel, LPN   Activities of Daily Living    10/23/2022   10:58 AM  In your present state of health, do you have any difficulty performing the following activities:  Hearing? 0  Vision? 0  Difficulty concentrating or making decisions? 0  Walking or climbing stairs? 0  Dressing or bathing? 0  Doing errands, shopping? 0  Preparing Food and eating ? N  Using the Toilet? N  In the past six months, have you accidently leaked urine? N  Do you have problems with loss of bowel control? N  Managing your Medications? N  Managing your Finances? N  Housekeeping or managing your Housekeeping? N    Patient Care Team: Vivi Barrack, MD as PCP - General (Family Medicine)  Indicate any recent Medical Services you may have received from other than [facility] providers in the past year (date may be approximate).     Assessment:   This is a routine wellness examination for Morgan Carter.  Hearing/Vision screen Hearing Screening - Comments:: Pt denies any hearing issues  Vision Screening - Comments:: Pt follows up  with Dr Delman Cheadle  for annual eye exams   Dietary issues and exercise activities discussed: Current Exercise Habits: The patient does not participate in regular exercise at present   Goals Addressed             This Visit's Progress    Patient Stated       Patient Stated       Stay healthy and active        Depression Screen    10/23/2022   10:52 AM 09/10/2022   11:43 AM 09/03/2022     9:55 AM  PHQ 2/9 Scores  PHQ - 2 Score 0 0 0    Fall Risk    10/23/2022   10:57 AM 09/10/2022   11:42 AM 09/03/2022    9:55 AM  Fall Risk   Falls in the past year? 1 0 0  Number falls in past yr: 1 0 0  Injury with Fall? 1 0 0  Comment broke  right foot    Risk for fall due to : Impaired vision No Fall Risks No Fall Risks  Risk for fall due to: Comment wears a boot denies any balance issues    Follow up Falls prevention discussed Falls evaluation completed     FALL RISK PREVENTION PERTAINING TO THE HOME:  Any stairs in or around the home? Yes  If so, are there any without handrails? No  Home free of loose throw rugs in walkways, pet beds, electrical cords, etc? Yes  Adequate lighting in your home to reduce risk of falls? Yes   ASSISTIVE DEVICES UTILIZED TO PREVENT FALLS:  Life alert? No  Use of a cane, walker or w/c? No  Grab bars in the bathroom? No  Shower chair or bench in shower? No  Elevated toilet seat or a handicapped toilet? No   TIMED UP AND GO:  Was the test performed? No .   Cognitive Function:        10/23/2022   10:59 AM  6CIT Screen  What Year? 0 points  What month? 0 points  What time? 0 points  Count back from 20 0 points  Months in reverse 0 points  Repeat phrase 0 points  Total Score 0 points    Immunizations Immunization History  Administered Date(s) Administered   Fluad Quad(high Dose 65+) 08/30/2022   H1N1 11/05/2008   Hepatitis A 04/29/2007, 05/29/2007, 10/28/2007  Hepatitis B 04/29/2007, 05/29/2007, 11/03/2007   Influenza,inj,Quad PF,6+ Mos 08/27/2019   PFIZER(Purple Top)SARS-COV-2 Vaccination 12/17/2019, 01/07/2020, 08/30/2020, 05/01/2021   Pfizer Covid-19 Vaccine Bivalent Booster 15yr & up 10/22/2021   Tdap 04/04/2022   Typhoid Live 04/29/2007   Yellow Fever 10/28/2007    TDAP status: Up to date  Flu Vaccine status: Up to date  Pneumococcal vaccine status: Due, Education has been provided regarding the importance of  this vaccine. Advised may receive this vaccine at local pharmacy or Health Dept. Aware to provide a copy of the vaccination record if obtained from local pharmacy or Health Dept. Verbalized acceptance and understanding.  Covid-19 vaccine status: Completed vaccines  Qualifies for Shingles Vaccine? Yes   Zostavax completed No   Shingrix Completed?: No.    Education has been provided regarding the importance of this vaccine. Patient has been advised to call insurance company to determine out of pocket expense if they have not yet received this vaccine. Advised may also receive vaccine at local pharmacy or Health Dept. Verbalized acceptance and understanding.  Screening Tests Health Maintenance  Topic Date Due   Hepatitis C Screening  Never done   COLONOSCOPY (Pts 45-458yrInsurance coverage will need to be confirmed)  Never done   Zoster Vaccines- Shingrix (1 of 2) Never done   Pneumonia Vaccine 6568Years old (1 - PCV) Never done   COVID-19 Vaccine (6 - 2023-24 season) 07/27/2022   Medicare Annual Wellness (AWV)  10/24/2023   MAMMOGRAM  09/04/2024   INFLUENZA VACCINE  Completed   DEXA SCAN  Completed   HPV VACCINES  Aged Out    Health Maintenance  Health Maintenance Due  Topic Date Due   Hepatitis C Screening  Never done   COLONOSCOPY (Pts [id number] coverage will need to be confirmed)  Never done   Zoster Vaccines- Shingrix (1 of 2) Never done   Pneumonia Vaccine 65+22ears old (1 - PCV) Never done   COVID-19 Vaccine (6 - 2023-24 season) 07/27/2022    Colorectal cancer screening: Type of screening: Colonoscopy. Completed pt stated not due as of yet . Repeat every 10 years  Mammogram status: Completed 09/04/22. Repeat every year  Bone Density status: Completed 07/07/18. Results reflect: Bone density results: OSTEOPENIA. Repeat every 2 years.   Additional Screening:  Hepatitis C Screening: does qualify;  Vision Screening: Recommended annual ophthalmology exams for early  detection of glaucoma and other disorders of the eye. Is the patient up to date with their annual eye exam?  Yes  Who is the provider or what is the name of the office in which the patient attends annual eye exams? Dr GouDelman Cheadlef pt is not established with a provider, would they like to be referred to a provider to establish care? No .   Dental Screening: Recommended annual dental exams for proper oral hygiene  Community Resource Referral / Chronic Care Management: CRR required this visit?  No   CCM required this visit?  No      Plan:     I have personally reviewed and noted the following in the patient's chart:   Medical and social history Use of alcohol, tobacco or illicit drugs  Current medications and supplements including opioid prescriptions. Patient is not currently taking opioid prescriptions. Functional ability and status Nutritional status Physical activity Advanced directives List of other physicians Hospitalizations, surgeries, and ER visits in previous 12 months Vitals Screenings to include cognitive, depression, and falls Referrals and appointments  In addition, I have  reviewed and discussed with patient certain preventive protocols, quality metrics, and best practice recommendations. A written personalized care plan for preventive services as well as general preventive health recommendations were provided to patient.     Willette Brace, LPN   [id number]   Nurse Notes: none

## 2022-10-23 NOTE — Patient Instructions (Signed)
[name] , Thank you for taking time to come for your Medicare Wellness Visit. I appreciate your ongoing commitment to your health goals. Please review the following plan we discussed and let me know if I can assist you in the future.   These are the goals we discussed:  Goals      Patient Stated     Patient Stated     Stay healthy and active         This is a list of the screening recommended for you and due dates:  Health Maintenance  Topic Date Due   Hepatitis C Screening: USPSTF Recommendation to screen - Ages 71-79 yo.  Never done   Colon Cancer Screening  Never done   Zoster (Shingles) Vaccine (1 of 2) Never done   Pneumonia Vaccine (1 - PCV) Never done   COVID-19 Vaccine (6 - 2023-24 season) 07/27/2022   Medicare Annual Wellness Visit  10/24/2023   Mammogram  09/04/2024   Flu Shot  Completed   DEXA scan (bone density measurement)  Completed   HPV Vaccine  Aged Out    Advanced directives: Please bring a copy of your health care power of attorney and living will to the office at your convenience.  Conditions/risks identified: stay healthy and active   Next appointment: Follow up in one year for your annual wellness visit    Preventive Care 65 Years and Older, Female Preventive care refers to lifestyle choices and visits with your health care provider that can promote health and wellness. What does preventive care include? A yearly physical exam. This is also called an annual well check. Dental exams once or twice a year. Routine eye exams. Ask your health care provider how often you should have your eyes checked. Personal lifestyle choices, including: Daily care of your teeth and gums. Regular physical activity. Eating a healthy diet. Avoiding tobacco and drug use. Limiting alcohol use. Practicing safe sex. Taking low-dose aspirin every day. Taking vitamin and mineral supplements as recommended by your health care provider. What happens during an annual well  check? The services and screenings done by your health care provider during your annual well check will depend on your age, overall health, lifestyle risk factors, and family history of disease. Counseling  Your health care provider may ask you questions about your: Alcohol use. Tobacco use. Drug use. Emotional well-being. Home and relationship well-being. Sexual activity. Eating habits. History of falls. Memory and ability to understand (cognition). Work and work Statistician. Reproductive health. Screening  You may have the following tests or measurements: Height, weight, and BMI. Blood pressure. Lipid and cholesterol levels. These may be checked every 5 years, or more frequently if you are over 45 years old. Skin check. Lung cancer screening. You may have this screening every year starting at age 33 if you have a 30-pack-year history of smoking and currently smoke or have quit within the past 15 years. Fecal occult blood test (FOBT) of the stool. You may have this test every year starting at age 72. Flexible sigmoidoscopy or colonoscopy. You may have a sigmoidoscopy every 5 years or a colonoscopy every 10 years starting at age 44. Hepatitis C blood test. Hepatitis B blood test. Sexually transmitted disease (STD) testing. Diabetes screening. This is done by checking your blood sugar (glucose) after you have not eaten for a while (fasting). You may have this done every 1-3 years. Bone density scan. This is done to screen for osteoporosis. You may have this done  starting at age 39. Mammogram. This may be done every 1-2 years. Talk to your health care provider about how often you should have regular mammograms. Talk with your health care provider about your test results, treatment options, and if necessary, the need for more tests. Vaccines  Your health care provider may recommend certain vaccines, such as: Influenza vaccine. This is recommended every year. Tetanus, diphtheria, and  acellular pertussis (Tdap, Td) vaccine. You may need a Td booster every 10 years. Zoster vaccine. You may need this after age 46. Pneumococcal 13-valent conjugate (PCV13) vaccine. One dose is recommended after age 18. Pneumococcal polysaccharide (PPSV23) vaccine. One dose is recommended after age 13. Talk to your health care provider about which screenings and vaccines you need and how often you need them. This information is not intended to replace advice given to you by your health care provider. Make sure you discuss any questions you have with your health care provider. Document Released: 12/09/2015 Document Revised: 08/01/2016 Document Reviewed: 09/13/2015 Elsevier Interactive Patient Education  2017 [place] Prevention in the Home Falls can cause injuries. They can happen to people of all ages. There are many things you can do to make your home safe and to help prevent falls. What can I do on the outside of my home? Regularly fix the edges of walkways and driveways and fix any cracks. Remove anything that might make you trip as you walk through a door, such as a raised step or threshold. Trim any bushes or trees on the path to your home. Use bright outdoor lighting. Clear any walking paths of anything that might make someone trip, such as rocks or tools. Regularly check to see if handrails are loose or broken. Make sure that both sides of any steps have handrails. Any raised decks and porches should have guardrails on the edges. Have any leaves, snow, or ice cleared regularly. Use sand or salt on walking paths during winter. Clean up any spills in your garage right away. This includes oil or grease spills. What can I do in the bathroom? Use night lights. Install grab bars by the toilet and in the tub and shower. Do not use towel bars as grab bars. Use non-skid mats or decals in the tub or shower. If you need to sit down in the shower, use a plastic, non-slip stool. Keep  the floor dry. Clean up any water that spills on the floor as soon as it happens. Remove soap buildup in the tub or shower regularly. Attach bath mats securely with double-sided non-slip rug tape. Do not have throw rugs and other things on the floor that can make you trip. What can I do in the bedroom? Use night lights. Make sure that you have a light by your bed that is easy to reach. Do not use any sheets or blankets that are too big for your bed. They should not hang down onto the floor. Have a firm chair that has side arms. You can use this for support while you get dressed. Do not have throw rugs and other things on the floor that can make you trip. What can I do in the kitchen? Clean up any spills right away. Avoid walking on wet floors. Keep items that you use a lot in easy-to-reach places. If you need to reach something above you, use a strong step stool that has a grab bar. Keep electrical cords out of the way. Do not use floor polish or wax that  makes floors slippery. If you must use wax, use non-skid floor wax. Do not have throw rugs and other things on the floor that can make you trip. What can I do with my stairs? Do not leave any items on the stairs. Make sure that there are handrails on both sides of the stairs and use them. Fix handrails that are broken or loose. Make sure that handrails are as long as the stairways. Check any carpeting to make sure that it is firmly attached to the stairs. Fix any carpet that is loose or worn. Avoid having throw rugs at the top or bottom of the stairs. If you do have throw rugs, attach them to the floor with carpet tape. Make sure that you have a light switch at the top of the stairs and the bottom of the stairs. If you do not have them, ask someone to add them for you. What else can I do to help prevent falls? Wear shoes that: Do not have high heels. Have rubber bottoms. Are comfortable and fit you well. Are closed at the toe. Do not  wear sandals. If you use a stepladder: Make sure that it is fully opened. Do not climb a closed stepladder. Make sure that both sides of the stepladder are locked into place. Ask someone to hold it for you, if possible. Clearly mark and make sure that you can see: Any grab bars or handrails. First and last steps. Where the edge of each step is. Use tools that help you move around (mobility aids) if they are needed. These include: Canes. Walkers. Scooters. Crutches. Turn on the lights when you go into a dark area. Replace any light bulbs as soon as they burn out. Set up your furniture so you have a clear path. Avoid moving your furniture around. If any of your floors are uneven, fix them. If there are any pets around you, be aware of where they are. Review your medicines with your doctor. Some medicines can make you feel dizzy. This can increase your chance of falling. Ask your doctor what other things that you can do to help prevent falls. This information is not intended to replace advice given to you by your health care provider. Make sure you discuss any questions you have with your health care provider. Document Released: 09/08/2009 Document Revised: 04/19/2016 Document Reviewed: 12/17/2014 Elsevier Interactive Patient Education  2017 [employer].

## 2022-10-26 DIAGNOSIS — S92301A Fracture of unspecified metatarsal bone(s), right foot, initial encounter for closed fracture: Secondary | ICD-10-CM | POA: Diagnosis not present

## 2022-11-23 DIAGNOSIS — S92301D Fracture of unspecified metatarsal bone(s), right foot, subsequent encounter for fracture with routine healing: Secondary | ICD-10-CM | POA: Diagnosis not present

## 2022-12-28 DIAGNOSIS — S92301D Fracture of unspecified metatarsal bone(s), right foot, subsequent encounter for fracture with routine healing: Secondary | ICD-10-CM | POA: Diagnosis not present

## 2023-01-18 ENCOUNTER — Other Ambulatory Visit: Payer: Self-pay | Admitting: Family Medicine

## 2023-01-18 DIAGNOSIS — I10 Essential (primary) hypertension: Secondary | ICD-10-CM

## 2023-02-05 ENCOUNTER — Other Ambulatory Visit: Payer: Self-pay | Admitting: Family Medicine

## 2023-02-05 DIAGNOSIS — E7800 Pure hypercholesterolemia, unspecified: Secondary | ICD-10-CM

## 2023-02-05 NOTE — Telephone Encounter (Signed)
LAST APPOINTMENT DATE: 09/03/2022   NEXT APPOINTMENT DATE: 03/06/2023    LAST REFILL: 08/03/2022  Refill: by historical provider

## 2023-03-06 ENCOUNTER — Ambulatory Visit (INDEPENDENT_AMBULATORY_CARE_PROVIDER_SITE_OTHER): Payer: Medicare Other | Admitting: Family Medicine

## 2023-03-06 ENCOUNTER — Encounter: Payer: Self-pay | Admitting: Family Medicine

## 2023-03-06 VITALS — BP 127/74 | HR 60 | Temp 98.2°F | Ht 64.5 in | Wt 135.6 lb

## 2023-03-06 DIAGNOSIS — E785 Hyperlipidemia, unspecified: Secondary | ICD-10-CM | POA: Diagnosis not present

## 2023-03-06 DIAGNOSIS — R739 Hyperglycemia, unspecified: Secondary | ICD-10-CM

## 2023-03-06 DIAGNOSIS — I10 Essential (primary) hypertension: Secondary | ICD-10-CM | POA: Diagnosis not present

## 2023-03-06 DIAGNOSIS — F419 Anxiety disorder, unspecified: Secondary | ICD-10-CM

## 2023-03-06 DIAGNOSIS — J45901 Unspecified asthma with (acute) exacerbation: Secondary | ICD-10-CM

## 2023-03-06 DIAGNOSIS — Z0001 Encounter for general adult medical examination with abnormal findings: Secondary | ICD-10-CM | POA: Diagnosis not present

## 2023-03-06 LAB — COMPREHENSIVE METABOLIC PANEL
ALT: 14 U/L (ref 0–35)
AST: 16 U/L (ref 0–37)
Albumin: 3.9 g/dL (ref 3.5–5.2)
Alkaline Phosphatase: 62 U/L (ref 39–117)
BUN: 14 mg/dL (ref 6–23)
CO2: 29 mEq/L (ref 19–32)
Calcium: 9.4 mg/dL (ref 8.4–10.5)
Chloride: 104 mEq/L (ref 96–112)
Creatinine, Ser: 0.78 mg/dL (ref 0.40–1.20)
GFR: 74.88 mL/min (ref >60.00)
Glucose, Bld: 81 mg/dL (ref 70–99)
Potassium: 4.2 mEq/L (ref 3.5–5.1)
Sodium: 140 mEq/L (ref 135–145)
Total Bilirubin: 0.3 mg/dL (ref 0.2–1.2)
Total Protein: 6 g/dL (ref 6.0–8.3)

## 2023-03-06 LAB — CBC
HCT: 39.1 % (ref 36.0–46.0)
Hemoglobin: 13.1 g/dL (ref 12.0–15.0)
MCHC: 33.6 g/dL (ref 30.0–36.0)
MCV: 98 fl (ref 78.0–100.0)
Platelets: 267 10*3/uL (ref 150.0–400.0)
RBC: 3.99 Mil/uL (ref 3.87–5.11)
RDW: 13.1 % (ref 11.5–15.5)
WBC: 5.7 10*3/uL (ref 4.0–10.5)

## 2023-03-06 LAB — LIPID PANEL
Cholesterol: 137 mg/dL (ref 0–200)
HDL: 48.6 mg/dL (ref >39.00)
LDL Cholesterol: 65 mg/dL (ref 0–99)
NonHDL: 87.9
Total CHOL/HDL Ratio: 3
Triglycerides: 114 mg/dL (ref 0.0–149.0)
VLDL: 22.8 mg/dL (ref 0.0–40.0)

## 2023-03-06 LAB — HEMOGLOBIN A1C: Hgb A1c MFr Bld: 5.6 % (ref 4.6–6.5)

## 2023-03-06 LAB — TSH: TSH: 5.68 u[IU]/mL — ABNORMAL HIGH (ref 0.35–5.50)

## 2023-03-06 MED ORDER — ALPRAZOLAM 0.5 MG PO TABS: 0.2500 mg | ORAL_TABLET | Freq: Two times a day (BID) | ORAL | 5 refills | Status: DC | PRN

## 2023-03-06 NOTE — Assessment & Plan Note (Signed)
Had a flare a few months ago but otherwise has been doing well.  She uses albuterol as needed infrequently.  Also takes Zyrtec as needed.  Does not need refills today.

## 2023-03-06 NOTE — Patient Instructions (Signed)
It was very nice to see you today!  I will refill your medications today.  We will check blood work.  Please let me know if you would like a referral to see a therapist or counselor.  I will see you back in year for your next physical.  Come back sooner if needed.  Take care, Dr Jimmey Ralph  PLEASE NOTE:  If you had any lab tests, please let us know if you have not heard back within a few days. You may see your results on mychart before we have a chance to review them but we will give you a call once they are reviewed by [place].   If we ordered any referrals today, please let us know if you have not heard from their office within the next week.   If you had any urgent prescriptions sent in today, please check with the pharmacy within an hour of our visit to make sure the prescription was transmitted appropriately.   Please try these tips to maintain a healthy lifestyle:  Eat at least 3 REAL meals and 1-2 snacks per day.  Aim for no more than 5 hours between eating.  If you eat breakfast, please do so within one hour of getting up.   Each meal should contain half fruits/vegetables, one quarter protein, and one quarter carbs (no bigger than a computer mouse)  Cut down on sweet beverages. This includes juice, soda, and sweet tea.   Drink at least 1 glass of water with each meal and aim for at least 8 glasses per day  Exercise at least 150 minutes every week.    Preventive Care 65 Years and Older, Female Preventive care refers to lifestyle choices and visits with your health care provider that can promote health and wellness. Preventive care visits are also called wellness exams. What can I expect for my preventive care visit? Counseling Your health care provider may ask you questions about your: Medical history, including: Past medical problems. Family medical history. Pregnancy and menstrual history. History of falls. Current health, including: Memory and ability to understand  (cognition). Emotional well-being. Home life and relationship well-being. Sexual activity and sexual health. Lifestyle, including: Alcohol, nicotine or tobacco, and drug use. Access to firearms. Diet, exercise, and sleep habits. Work and work Astronomer. Sunscreen use. Safety issues such as seatbelt and bike helmet use. Physical exam Your health care provider will check your: Height and weight. These may be used to calculate your BMI (body mass index). BMI is a measurement that tells if you are at a healthy weight. Waist circumference. This measures the distance around your waistline. This measurement also tells if you are at a healthy weight and may help predict your risk of certain diseases, such as type 2 diabetes and high blood pressure. Heart rate and blood pressure. Body temperature. Skin for abnormal spots. What immunizations do I need?  Vaccines are usually given at various ages, according to a schedule. Your health care provider will recommend vaccines for you based on your age, medical history, and lifestyle or other factors, such as travel or where you work. What tests do I need? Screening Your health care provider may recommend screening tests for certain conditions. This may include: Lipid and cholesterol levels. Hepatitis C test. Hepatitis B test. HIV (human immunodeficiency virus) test. STI (sexually transmitted infection) testing, if you are at risk. Lung cancer screening. Colorectal cancer screening. Diabetes screening. This is done by checking your blood sugar (glucose) after you have not eaten  for a while (fasting). Mammogram. Talk with your health care provider about how often you should have regular mammograms. BRCA-related cancer screening. This may be done if you have a family history of breast, ovarian, tubal, or peritoneal cancers. Bone density scan. This is done to screen for osteoporosis. Talk with your health care provider about your test results,  treatment options, and if necessary, the need for more tests. Follow these instructions at home: Eating and drinking  Eat a diet that includes fresh fruits and vegetables, whole grains, lean protein, and low-fat dairy products. Limit your intake of foods with high amounts of sugar, saturated fats, and salt. Take vitamin and mineral supplements as recommended by your health care provider. Do not drink alcohol if your health care provider tells you not to drink. If you drink alcohol: Limit how much you have to 0-1 drink a day. Know how much alcohol is in your drink. In the [place] one drink equals one 12 oz bottle of beer (355 mL), one 5 oz glass of wine (148 mL), or one 1 oz glass of hard liquor (44 mL). Lifestyle Brush your teeth every morning and night with fluoride toothpaste. Floss one time each day. Exercise for at least 30 minutes 5 or more days each week. Do not use any products that contain nicotine or tobacco. These products include cigarettes, chewing tobacco, and vaping devices, such as e-cigarettes. If you need help quitting, ask your health care provider. Do not use drugs. If you are sexually active, practice safe sex. Use a condom or other form of protection in order to prevent STIs. Take aspirin only as told by your health care provider. Make sure that you understand how much to take and what form to take. Work with your health care provider to find out whether it is safe and beneficial for you to take aspirin daily. Ask your health care provider if you need to take a cholesterol-lowering medicine (statin). Find healthy ways to manage stress, such as: Meditation, yoga, or listening to music. Journaling. Talking to a trusted person. Spending time with friends and family. Minimize exposure to UV radiation to reduce your risk of skin cancer. Safety Always wear your seat belt while driving or riding in a vehicle. Do not drive: If you have been drinking alcohol. Do not ride with  someone who has been drinking. When you are tired or distracted. While texting. If you have been using any mind-altering substances or drugs. Wear a helmet and other protective equipment during sports activities. If you have firearms in your house, make sure you follow all gun safety procedures. What's next? Visit your health care provider once a year for an annual wellness visit. Ask your health care provider how often you should have your eyes and teeth checked. Stay up to date on all vaccines. This information is not intended to replace advice given to you by your health care provider. Make sure you discuss any questions you have with your health care provider. Document Revised: 05/10/2021 Document Reviewed: 05/10/2021 Elsevier Patient Education  2023 [employer].
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
## 2023-03-06 NOTE — Assessment & Plan Note (Addendum)
She is still under quite a bit of stress related to health of her daughter and grandchildren.  She is taking xanax as needed which works well.  No significant side effects.  Will refill this today.  Her grandchild's custody hearing will be in a couple weeks and she hopes that this will alleviate some of the anxiety though she is still having a lot of stress due to the mental health of her daughter who is dealing with addiction issues.  We did discuss referral to see therapist however she declined for now.  She will reach out to [place] if she changes her mind.

## 2023-03-06 NOTE — Assessment & Plan Note (Signed)
Continue Crestor 5 mg daily.  Check lipids.

## 2023-03-06 NOTE — Assessment & Plan Note (Signed)
At goal.  Continue lisinopril 20 mg daily. 

## 2023-03-07 NOTE — Progress Notes (Signed)
Please inform patient of the following:  Her thyroid is slightly off.  Please have her come back to recheck TSH, free T4, and free T3.  The rest of her labs are all stable.  She should keep up the great work with diet and exercise and we can recheck everything else in a year.  Morgan Carter. Jimmey Ralph, MD 03/07/2023 12:26 PM

## 2023-03-12 ENCOUNTER — Other Ambulatory Visit: Payer: Self-pay

## 2023-03-12 DIAGNOSIS — R7989 Other specified abnormal findings of blood chemistry: Secondary | ICD-10-CM

## 2023-03-14 ENCOUNTER — Other Ambulatory Visit: Payer: Medicare Other

## 2023-03-26 DIAGNOSIS — K080 Exfoliation of teeth due to systemic causes: Secondary | ICD-10-CM | POA: Diagnosis not present

## 2023-04-18 ENCOUNTER — Other Ambulatory Visit (INDEPENDENT_AMBULATORY_CARE_PROVIDER_SITE_OTHER): Payer: Medicare Other

## 2023-04-18 DIAGNOSIS — R7989 Other specified abnormal findings of blood chemistry: Secondary | ICD-10-CM | POA: Diagnosis not present

## 2023-04-18 LAB — TSH: TSH: 3.06 u[IU]/mL (ref 0.35–5.50)

## 2023-04-18 LAB — T4, FREE: Free T4: 0.84 ng/dL (ref 0.60–1.60)

## 2023-04-18 LAB — T3, FREE: T3, Free: 2.5 pg/mL (ref 2.3–4.2)

## 2023-04-19 NOTE — Progress Notes (Signed)
Thyroid levels are back to normal.  Do not need to make any changes to her treatment plan at this time.  We can recheck again in a year.

## 2023-05-20 DIAGNOSIS — L400 Psoriasis vulgaris: Secondary | ICD-10-CM | POA: Diagnosis not present

## 2023-07-10 DIAGNOSIS — G4700 Insomnia, unspecified: Secondary | ICD-10-CM | POA: Diagnosis not present

## 2023-07-29 ENCOUNTER — Other Ambulatory Visit: Payer: Self-pay | Admitting: Family Medicine

## 2023-07-29 DIAGNOSIS — E7800 Pure hypercholesterolemia, unspecified: Secondary | ICD-10-CM

## 2023-08-09 ENCOUNTER — Other Ambulatory Visit: Payer: Self-pay | Admitting: Family Medicine

## 2023-08-09 DIAGNOSIS — Z1231 Encounter for screening mammogram for malignant neoplasm of breast: Secondary | ICD-10-CM

## 2023-08-19 ENCOUNTER — Ambulatory Visit (INDEPENDENT_AMBULATORY_CARE_PROVIDER_SITE_OTHER): Payer: Medicare Other | Admitting: Family Medicine

## 2023-08-19 ENCOUNTER — Encounter: Payer: Self-pay | Admitting: Family Medicine

## 2023-08-19 VITALS — BP 168/81 | HR 66 | Temp 97.3°F | Ht 64.5 in | Wt 140.0 lb

## 2023-08-19 DIAGNOSIS — F419 Anxiety disorder, unspecified: Secondary | ICD-10-CM

## 2023-08-19 DIAGNOSIS — Z1211 Encounter for screening for malignant neoplasm of colon: Secondary | ICD-10-CM | POA: Diagnosis not present

## 2023-08-19 DIAGNOSIS — I10 Essential (primary) hypertension: Secondary | ICD-10-CM | POA: Diagnosis not present

## 2023-08-19 DIAGNOSIS — M25551 Pain in right hip: Secondary | ICD-10-CM

## 2023-08-19 MED ORDER — MELOXICAM 15 MG PO TABS
15.0000 mg | ORAL_TABLET | Freq: Every day | ORAL | 0 refills | Status: DC
Start: 2023-08-19 — End: 2023-09-16

## 2023-08-19 NOTE — Progress Notes (Signed)
   Morgan Carter is a 77 y.o. female who presents today for an office visit.  Assessment/Plan:  New/Acute Problems: Right Hip Pain History and exam consistent with piriformis syndrome.  We discussed home exercises and handout was given.  Also start meloxicam 15 mg daily for the next 1 to 2 weeks.  She would like to see orthopedics for this.  Will place referral today.  We discussed reasons to return to care.  Chronic Problems Addressed Today: Anxiety Overall symptoms are manageable.  She uses Xanax as needed which works well.  She does have quite a bit of stress related to her grandchild's custody hearing which will be coming up in a couple of weeks however currently manageable with current treatment plan.  Does not need refill on Xanax today.  Essential hypertension Elevated today in setting acute pain.  Typically has been well-controlled.  She will monitor at home and let us know if still elevated over the next 1 to 2 weeks.  Continue lisinopril 20 mg daily.  Preventative health care Will refer for colonoscopy    Subjective:  HPI:  See A/P for status of chronic conditions.  Patient is here with right hip pain for the last 5-6 weeks.  Pain is so severe that it is preventing her from walking.  Symptoms started after having a very busy day with housework and a taking a long walk afterwards. She is having pain in her posterior buttocks. Sometimes feels like a electrical shock. Pain has been so severe that it has limited her ability to walk. Pain does radiate into her outer hip. Tried OTC medications with some improvement. Getting a little better today.        Objective:  Physical Exam: BP (!) 168/81   Pulse 66   Temp (!) 97.3 F (36.3 C) (Temporal)   Ht 5' 4.5" (1.638 m)   Wt 140 lb (63.5 kg)   SpO2 100%   BMI 23.66 kg/m   Gen: No acute distress, resting comfortably MUSCULOSKELETAL - Legs: No deformities.  Slight pain in right hip with internal/external rotation.  Pain  elicited with FABER maneuver on right as well.  Neurovascular intact distally.  Full range of motion throughout. Neuro: Grossly normal, moves all extremities Psych: Normal affect and thought content      Morgan Carter M. Jimmey Ralph, MD 08/19/2023 8:21 AM

## 2023-08-19 NOTE — Patient Instructions (Signed)
It was very nice to see you today!  I think you have piriformis syndrome.  Work on exercises.  Start meloxicam.  I will refer you to see orthopedics.  Let us know if symptoms worsen.  Return if symptoms worsen or fail to improve.   Take care, Dr Jimmey Ralph  PLEASE NOTE:  If you had any lab tests, please let us know if you have not heard back within a few days. You may see your results on mychart before we have a chance to review them but we will give you a call once they are reviewed by [place].   If we ordered any referrals today, please let us know if you have not heard from their office within the next week.   If you had any urgent prescriptions sent in today, please check with the pharmacy within an hour of our visit to make sure the prescription was transmitted appropriately.   Please try these tips to maintain a healthy lifestyle:  Eat at least 3 REAL meals and 1-2 snacks per day.  Aim for no more than 5 hours between eating.  If you eat breakfast, please do so within one hour of getting up.   Each meal should contain half fruits/vegetables, one quarter protein, and one quarter carbs (no bigger than a computer mouse)  Cut down on sweet beverages. This includes juice, soda, and sweet tea.   Drink at least 1 glass of water with each meal and aim for at least 8 glasses per day  Exercise at least 150 minutes every week.

## 2023-08-19 NOTE — Assessment & Plan Note (Signed)
Overall symptoms are manageable.  She uses Xanax as needed which works well.  She does have quite a bit of stress related to her grandchild's custody hearing which will be coming up in a couple of weeks however currently manageable with current treatment plan.  Does not need refill on Xanax today.

## 2023-08-19 NOTE — Assessment & Plan Note (Signed)
Elevated today in setting acute pain.  Typically has been well-controlled.  She will monitor at home and let us know if still elevated over the next 1 to 2 weeks.  Continue lisinopril 20 mg daily.

## 2023-08-28 ENCOUNTER — Ambulatory Visit: Payer: Medicare Other | Admitting: Orthopaedic Surgery

## 2023-08-28 ENCOUNTER — Other Ambulatory Visit (INDEPENDENT_AMBULATORY_CARE_PROVIDER_SITE_OTHER): Payer: Medicare Other

## 2023-08-28 ENCOUNTER — Other Ambulatory Visit (INDEPENDENT_AMBULATORY_CARE_PROVIDER_SITE_OTHER): Payer: Self-pay

## 2023-08-28 DIAGNOSIS — M79604 Pain in right leg: Secondary | ICD-10-CM

## 2023-08-28 NOTE — Progress Notes (Signed)
The patient is a 77 year old female who comes in with right-sided low back pain and sciatic type of symptoms.  She denies any groin pain but does get a stabbing sensation occasionally on the side of her right hip when she walks.  She has taken meloxicam and that has helped some.  She does exercise regularly and stays active.  She is otherwise a very healthy 77 year old female.  On exam she is very flexible with full flexion extension of her lumbar spine.  Her left and right hips move smoothly and fluidly with no blocks or rotation no pain in the groin.  There is no pain over the right hip trochanteric area.  Most of her pain seems to be in the lower lumbar spine to the right side in the upper pelvis area of the SI joint.  An AP and lateral of the lumbar spine shows a grade 1 spondylolisthesis between L4 and L5 and there is arthritic changes in the posterior elements.  An AP pelvis lateral right hip shows normal-appearing hip joint spaces bilaterally.  There are cortical irregularities around the right hip trochanteric area suggesting an old avulsion injury or calcifications.  Her hip abduction is strong.  She is someone who is very interested in at least a lumbar spine maintenance program.  I believe that she would benefit from at least 1-3 sessions of outpatient physical therapy they can provide her what she needs in terms of showing her appropriate exercises to strengthen the lumbar spine and to calm down any pain in the right lower back area such as the facet joints.  If things worsen she will let us know.  We will set her up for physical therapy with a goal of the home exercise program.  Follow-up can be as needed.  Things worsen she knows to let us know for sure.

## 2023-08-29 ENCOUNTER — Other Ambulatory Visit: Payer: Self-pay

## 2023-08-29 DIAGNOSIS — M79604 Pain in right leg: Secondary | ICD-10-CM

## 2023-09-09 ENCOUNTER — Ambulatory Visit
Admission: RE | Admit: 2023-09-09 | Discharge: 2023-09-09 | Disposition: A | Discharge status: Home / Self Care | Payer: Medicare Other | Source: Ambulatory Visit | Attending: Family Medicine | Admitting: Family Medicine

## 2023-09-09 DIAGNOSIS — Z1231 Encounter for screening mammogram for malignant neoplasm of breast: Secondary | ICD-10-CM

## 2023-09-10 ENCOUNTER — Ambulatory Visit: Payer: Medicare Other | Admitting: Rehabilitative and Restorative Service Providers"

## 2023-09-15 ENCOUNTER — Other Ambulatory Visit: Payer: Self-pay | Admitting: Family Medicine

## 2023-09-18 NOTE — Therapy (Signed)
OUTPATIENT PHYSICAL THERAPY EVALUATION   Patient Name: Morgan Carter MRN: [id number] DOB:[birth year], 77 y.o., female Today's Date: 09/19/2023  END OF SESSION:  PT End of Session - 09/19/23 1013     Visit Number 1    Number of Visits 20    Date for PT Re-Evaluation 11/28/23    Authorization Type [employer] Medicare $10 copay    Progress Note Due on Visit 10    PT Start Time 1017    PT Stop Time 1050    PT Time Calculation (min) 33 min    Activity Tolerance Patient tolerated treatment well    Behavior During Therapy WFL for tasks assessed/performed             Past Medical History:  Diagnosis Date   Hyperlipidemia    Hypertension    Past Surgical History:  Procedure Laterality Date   BREAST BIOPSY Left 1998   benign   BREAST BIOPSY Right 1992   benign   BREAST BIOPSY Right 1991   benign   BREAST BIOPSY Right 1990   benign   CESAREAN SECTION  11/10/1984   KNEE SURGERY  1992   Patient Active Problem List   Diagnosis Date Noted   Psoriasis 09/03/2022   Essential hypertension 09/03/2022   Dyslipidemia 09/03/2022   Anxiety 09/03/2022   Reactive airway disease 09/03/2022    PCP: Ardith Dark. MD  REFERRING PROVIDER: Kathryne Hitch, MD  REFERRING DIAG: [phone] (ICD-10-CM) - Pain in right leg  Rationale for Evaluation and Treatment: Rehabilitation  THERAPY DIAG:  Other low back pain  Pain in right leg  Muscle weakness (generalized)  Difficulty in walking, not elsewhere classified  ONSET DATE: 2024 insidious  SUBJECTIVE:                                                                                                                                                                                           SUBJECTIVE STATEMENT: Pt indicated seeing MD due to severe pain from back into Rt leg//buttock.  Pt indicated having difficulty in walking due to pain symptoms, difficulty with stairs.  Pt indicated having difficulty putting weight on leg.   Pt indicated uncertain of cause of troubles but may connect to mowing and hiking activity.  Pt indicated no numbness/tingling.   Pt indicated she wanted to do things to keep active and be as fit as she can be.    PERTINENT HISTORY:  HTN, hyperlipidemia Reported a fall last year with broken foot but not in last 6 months.  History of surgery in knees.   PAIN:  NPRS scale: 2-3/10, at worst 9/10 Pain location:Rt  back of buttock/leg in thigh Pain description: electrical shock Aggravating factors: WB activity in Rt leg, kneeling, bending, walking Relieving factors: rubbing/deep massage  PRECAUTIONS: None  WEIGHT BEARING RESTRICTIONS: No  FALLS:  Has patient fallen in last 6 months? No  LIVING ENVIRONMENT: Lives in: House/apartment Stairs: 3-4 entry, 6 in back with one rail.   OCCUPATION: Full time professor [employer]   PLOF: Independent, raises young grandson, walks down.  May like water aerobics   PATIENT GOALS: Reduce pain, get activity.  OBJECTIVE:   IMAGING: 09/19/2023 review: An AP and lateral of the lumbar spine shows a grade 1 spondylolisthesis between L4 and L5 and there is arthritic changes in the posterior elements. An AP pelvis lateral right hip shows normal-appearing hip joint spaces bilaterally. There are cortical irregularities around the right hip trochanteric area suggesting an old avulsion injury or calcifications.   PATIENT SURVEYS:  09/19/2023 FOTO eval: 51   predicted:  65  SCREENING FOR RED FLAGS: 09/19/2023 Bowel or bladder incontinence: No Cauda equina syndrome: No  COGNITION: 09/19/2023 Overall cognitive status: WFL normal      SENSATION: 09/19/2023 [facility]  MUSCLE LENGTH: 09/19/2023 Passive hamstring SLR bilaterally 90 deg without pain complaints.   POSTURE:  09/19/2023 Mild increase in lumbar lordosis in standing.   PALPATION: 09/19/2023 Mild tenderness Rt glute max  LUMBAR ROM:   AROM 09/19/2023  Flexion To toes without  complaint  Extension 100% WFL without complaints  Right lateral flexion   Left lateral flexion   Right rotation   Left rotation    (Blank rows = not tested)  LOWER EXTREMITY ROM:      Right 09/19/2023 Left 09/19/2023  Hip flexion    Hip extension    Hip abduction    Hip adduction    Hip internal rotation    Hip external rotation    Knee flexion    Knee extension    Ankle dorsiflexion    Ankle plantarflexion    Ankle inversion    Ankle eversion     (Blank rows = not tested)  LOWER EXTREMITY MMT:    MMT Right 09/19/2023 Left 09/19/2023  Hip flexion 5/5 5/5  Hip extension 4/5 4/5  Hip abduction 3+/5 4+/5  Hip adduction    Hip internal rotation    Hip external rotation    Knee flexion 5/5 5/5  Knee extension 5/5 5/5  Ankle dorsiflexion 5/5 5/5  Ankle plantarflexion    Ankle inversion    Ankle eversion     (Blank rows = not tested)  SPECIAL TESTS:  09/19/2023 (-) Slump, crossed SLR bilaterally in supine  FUNCTIONAL TESTS:  09/19/2023 18 inch chair without UE on 1st try  GAIT: 09/19/2023 Independent ambulation  TODAY'S TREATMENT:                                                                                                         DATE: 09/19/2023  Therex:    HEP instruction/performance c cues for techniques, handout provided.  Trial set performed of each for comprehension and symptom assessment.  See below for exercise list  PATIENT EDUCATION:  09/19/2023 Education details: HEP, POC Person educated: Patient Education method: Explanation, Demonstration, Verbal cues, and Handouts Education comprehension: verbalized understanding, returned demonstration, and verbal cues required  HOME EXERCISE PROGRAM: Access Code: [email or web address] URL:  [email or web address] Date: 09/19/2023 Prepared by: Chyrel Masson  Exercises - Seated Scapular Retraction  - 2-3 x daily - 7 x weekly - 1 sets - 5 reps - 3-5 hold - Standing Lumbar Extension with Counter  - 2-3 x daily - 7 x weekly - 1 sets - 5 reps - Supine Figure 4 Piriformis Stretch with Leg Extension (Mirrored)  - 2-3 x daily - 7 x weekly - 1 sets - 3-5 reps - 15-30 hold - Supine Bridge  - 1-2 x daily - 7 x weekly - 1-2 sets - 10 reps - 2 hold - Clamshell  - 1-2 x daily - 7 x weekly - 2-3 sets - 10 reps  ASSESSMENT:  CLINICAL IMPRESSION: Patient is a 77 y.o. who comes to clinic with complaints of back/Rt hip/thigh pain with slight mobility deficits, strength and movement coordination deficits that impair their ability to perform usual daily and recreational functional activities without increase difficulty/symptoms at this time.  Patient to benefit from skilled PT services to address impairments and limitations to improve to previous level of function without restriction secondary to condition.   OBJECTIVE IMPAIRMENTS: decreased activity tolerance, decreased coordination, decreased endurance, decreased mobility, difficulty walking, decreased strength, increased fascial restrictions, impaired perceived functional ability, improper body mechanics, and pain.   ACTIVITY LIMITATIONS: carrying, lifting, bending, standing, squatting, stairs, and locomotion level  PARTICIPATION LIMITATIONS: meal prep, cleaning, laundry, interpersonal relationship, community activity, and exercise routine  PERSONAL FACTORS:  no specific factors  are also affecting patient's functional outcome.   REHAB POTENTIAL: Good  CLINICAL DECISION MAKING: Stable/uncomplicated  EVALUATION COMPLEXITY: Low   GOALS: Goals reviewed with patient? Yes  SHORT TERM GOALS: (target date for Short term goals are 3 weeks 10/10/2023)  1. Patient will demonstrate independent use of home exercise program to  maintain progress from in clinic treatments.  Goal status: New  LONG TERM GOALS: (target dates for all long term goals are 10 weeks  11/28/2023 )   1. Patient will demonstrate/report pain at worst less than or equal to 2/10 to facilitate minimal limitation in daily activity secondary to pain symptoms.  Goal status: New   2. Patient will demonstrate independent use of home exercise program to facilitate ability to maintain/progress functional gains from skilled physical therapy services.  Goal status: New   3. Patient will demonstrate FOTO outcome > or = 65 % to indicate reduced disability due to condition.  Goal status: New   4. Patient will  demonstrate lumbar extension 100 % WFL s symptoms to facilitate upright standing, walking posture at PLOF s limitation.  Goal status: New   5.  Patient will demonstrate bilateral hip MMT 5/5 throughout to facilitate improved stability and strength in transfers, stairs and daily activity.   Goal status: New   6.  Patient will demonstrate ability to perform gym based workout s restriction.  Goal status: New    PLAN:  PT FREQUENCY: 1x-2x/week  PT DURATION: 10 weeks  PLANNED INTERVENTIONS: Can include 13086- PT Re-evaluation, 97110-Therapeutic exercises, 97530- Therapeutic activity, [id number]- Neuromuscular re-education, 97535- Self Care, 97140- Manual therapy, [id number]- Gait training, [phone]- Orthotic Fit/training, [phone]- Canalith repositioning, [id number]- Aquatic Therapy, 97014- Electrical stimulation (unattended), [id number]- Electrical stimulation (manual), [id number]- Vasopneumatic device, [id number]- Ultrasound, [id number]- Traction (mechanical), [id number]- Ionotophoresis 4mg /ml Dexamethasone, Patient/Family education, Balance training, Stair training, Taping, Dry Needling, Joint mobilization, Joint manipulation, Spinal manipulation, Spinal mobilization, Scar mobilization, Vestibular training, Visual/preceptual remediation/compensation, DME instructions, Cryotherapy, and Moist  heat.  All performed as medically necessary.  All included unless contraindicated  PLAN FOR NEXT SESSION: Review HEP knowledge/results. Recheck ROM/FOTO based off symptom reportings.    Chyrel Masson, PT, DPT, OCS, ATC 09/19/23  10:54 AM

## 2023-09-19 ENCOUNTER — Ambulatory Visit: Payer: Medicare Other | Admitting: Rehabilitative and Restorative Service Providers"

## 2023-09-19 ENCOUNTER — Other Ambulatory Visit: Payer: Self-pay

## 2023-09-19 ENCOUNTER — Encounter: Payer: Self-pay | Admitting: Rehabilitative and Restorative Service Providers"

## 2023-09-19 DIAGNOSIS — M5459 Other low back pain: Secondary | ICD-10-CM | POA: Diagnosis not present

## 2023-09-19 DIAGNOSIS — R262 Difficulty in walking, not elsewhere classified: Secondary | ICD-10-CM | POA: Diagnosis not present

## 2023-09-19 DIAGNOSIS — M6281 Muscle weakness (generalized): Secondary | ICD-10-CM

## 2023-09-19 DIAGNOSIS — M79604 Pain in right leg: Secondary | ICD-10-CM

## 2023-09-25 ENCOUNTER — Other Ambulatory Visit: Payer: Self-pay | Admitting: Family Medicine

## 2023-10-03 DIAGNOSIS — K080 Exfoliation of teeth due to systemic causes: Secondary | ICD-10-CM | POA: Diagnosis not present

## 2023-10-10 ENCOUNTER — Encounter: Payer: Medicare Other | Admitting: Rehabilitative and Restorative Service Providers"

## 2023-10-14 ENCOUNTER — Telehealth: Payer: Self-pay | Admitting: Family Medicine

## 2023-10-14 ENCOUNTER — Other Ambulatory Visit: Payer: Self-pay | Admitting: *Deleted

## 2023-10-14 DIAGNOSIS — Z1211 Encounter for screening for malignant neoplasm of colon: Secondary | ICD-10-CM

## 2023-10-14 NOTE — Telephone Encounter (Signed)
Reason for Referral Request:  Colonoscopy  Has Patient been seen by PCP for this complaint? States yes  No, Please schedule patient for appointment for complaint.  Yes, Please find out following information:  Reason: Colon Cancer Screening  Referral to which Specialty: Gastroenterology  Preferred office/ provider:  Did not state

## 2023-10-14 NOTE — Telephone Encounter (Signed)
GI referral for colonoscopy placed

## 2023-10-14 NOTE — Telephone Encounter (Signed)
Ok to placed colonoscopy referral?

## 2023-10-14 NOTE — Telephone Encounter (Signed)
Ok with me. Please place any necessary orders. 

## 2023-10-15 ENCOUNTER — Encounter: Payer: Self-pay | Admitting: Internal Medicine

## 2023-10-21 ENCOUNTER — Encounter: Payer: Self-pay | Admitting: Internal Medicine

## 2023-10-21 ENCOUNTER — Ambulatory Visit (AMBULATORY_SURGERY_CENTER): Payer: Medicare Other

## 2023-10-21 VITALS — Ht 64.0 in | Wt 137.0 lb

## 2023-10-21 DIAGNOSIS — Z1211 Encounter for screening for malignant neoplasm of colon: Secondary | ICD-10-CM

## 2023-10-21 MED ORDER — NA SULFATE-K SULFATE-MG SULF 17.5-3.13-1.6 GM/177ML PO SOLN
1.0000 | Freq: Once | ORAL | 0 refills | Status: AC
Start: 2023-10-21 — End: 2023-10-21

## 2023-10-21 NOTE — Progress Notes (Signed)

## 2023-10-22 ENCOUNTER — Other Ambulatory Visit: Payer: Self-pay | Admitting: Family Medicine

## 2023-10-26 DIAGNOSIS — N390 Urinary tract infection, site not specified: Secondary | ICD-10-CM | POA: Diagnosis not present

## 2023-10-26 DIAGNOSIS — R319 Hematuria, unspecified: Secondary | ICD-10-CM | POA: Diagnosis not present

## 2023-10-26 DIAGNOSIS — R300 Dysuria: Secondary | ICD-10-CM | POA: Diagnosis not present

## 2023-10-28 ENCOUNTER — Telehealth: Payer: Self-pay | Admitting: Family Medicine

## 2023-10-28 NOTE — Telephone Encounter (Signed)
Was seen at [facility].   Advised to see a provider within 4 hrs or go to UC due to office being closed.   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 Translation No Nurse Assessment Nurse: Para March, RN, Jasmine Date/Time (Eastern Time): 10/26/2023 9:45:43 AM Confirm and document reason for call. If symptomatic, describe symptoms. ---Caller states that 4-5 days ago she noticed burning of urine and states that she has been having lower back pain and cloudy urine. No fever no chills. No blood in the urine. states having urgency. Does the patient have any new or worsening symptoms? ---Yes Will a triage be completed? ---Yes Related visit to physician within the last 2 weeks? ---N/A Does the PT have any chronic conditions? (i.e. diabetes, asthma, this includes High risk factors for pregnancy, etc.) ---Yes List chronic conditions. ---HTN, HLD Is this a behavioral health or substance abuse call? ---No Guidelines Guideline Title Affirmed Question Affirmed Notes Nurse Date/Time Lamount Cohen Time) Urination Pain - Female Side (flank) or lower back pain present Para March, RN, [facility] 10/26/2023 9:47:21 AM Disp. Time Lamount Cohen Time) Disposition Final User 10/26/2023 9:49:48 AM See HCP within 4 Hours (or  PCP triage) Yes Para March, RN, [facility] Final Disposition 10/26/2023 9:49:48 AM See HCP within 4 Hours (or PCP triage) Yes Para March, RN, [facility] Disagree/Comply Comply Caller Understands Yes PreDisposition Call Doctor Care Advice Given Per Guideline * IF OFFICE WILL BE OPEN: You need to be seen within the next 3 or 4 hours. Call your doctor (or NP/PA) now or as soon as the office opens. * UCC: Some UCCs can manage patients who are stable and have less serious symptoms (e.g., minor illnesses and injuries). The triager must know the [facility] capabilities before sending a patient there. If unsure, call ahead. CALL BACK IF: * You become worse Referrals GO TO FACILITY UNDECIDED

## 2023-10-29 NOTE — Telephone Encounter (Signed)
She was seen at urgent care for this.  Morgan Carter. Jimmey Ralph, MD 10/29/2023 1:30 PM

## 2023-10-29 NOTE — Telephone Encounter (Signed)
See note

## 2023-11-07 ENCOUNTER — Ambulatory Visit: Payer: Medicare Other | Admitting: Internal Medicine

## 2023-11-07 ENCOUNTER — Encounter: Payer: Self-pay | Admitting: Internal Medicine

## 2023-11-07 VITALS — BP 128/66 | HR 72 | Temp 97.7°F | Resp 15 | Ht 64.0 in | Wt 137.0 lb

## 2023-11-07 DIAGNOSIS — K635 Polyp of colon: Secondary | ICD-10-CM

## 2023-11-07 DIAGNOSIS — K5730 Diverticulosis of large intestine without perforation or abscess without bleeding: Secondary | ICD-10-CM | POA: Diagnosis not present

## 2023-11-07 DIAGNOSIS — K639 Disease of intestine, unspecified: Secondary | ICD-10-CM | POA: Diagnosis not present

## 2023-11-07 DIAGNOSIS — D122 Benign neoplasm of ascending colon: Secondary | ICD-10-CM | POA: Diagnosis not present

## 2023-11-07 DIAGNOSIS — Z1211 Encounter for screening for malignant neoplasm of colon: Secondary | ICD-10-CM | POA: Diagnosis not present

## 2023-11-07 DIAGNOSIS — D125 Benign neoplasm of sigmoid colon: Secondary | ICD-10-CM | POA: Diagnosis not present

## 2023-11-07 DIAGNOSIS — D124 Benign neoplasm of descending colon: Secondary | ICD-10-CM

## 2023-11-07 MED ORDER — SODIUM CHLORIDE 0.9 % IV SOLN: 500.0000 mL | Freq: Once | INTRAVENOUS | Status: DC

## 2023-11-07 NOTE — Progress Notes (Signed)
Called to room to assist during endoscopic procedure.  Patient ID and intended procedure confirmed with present staff. Received instructions for my participation in the procedure from the performing physician.  

## 2023-11-07 NOTE — Patient Instructions (Addendum)
Resume previous diet Continue present medications Await pathology results No screening colonoscopy necessary in the future, however, call Dr Marina Goodell with any GI concern in the future. Handouts/information given for polyps and diverticulosis   YOU HAD AN ENDOSCOPIC PROCEDURE TODAY AT THE [facility]:   Refer to the procedure report that was given to you for any specific questions about what was found during the examination.  If the procedure report does not answer your questions, please call your gastroenterologist to clarify.  If you requested that your care partner not be given the details of your procedure findings, then the procedure report has been included in a sealed envelope for you to review at your convenience later.  YOU SHOULD EXPECT: Some feelings of bloating in the abdomen. Passage of more gas than usual.  Walking can help get rid of the air that was put into your GI tract during the procedure and reduce the bloating. If you had a lower endoscopy (such as a colonoscopy or flexible sigmoidoscopy) you may notice spotting of blood in your stool or on the toilet paper. If you underwent a bowel prep for your procedure, you may not have a normal bowel movement for a few days.  Please Note:  You might notice some irritation and congestion in your nose or some drainage.  This is from the oxygen used during your procedure.  There is no need for concern and it should clear up in a day or so.  SYMPTOMS TO REPORT IMMEDIATELY:  Following lower endoscopy (colonoscopy):  Excessive amounts of blood in the stool  Significant tenderness or worsening of abdominal pains  Swelling of the abdomen that is new, acute  Fever of 100F or higher  For urgent or emergent issues, a gastroenterologist can be reached at any hour by calling [phone]. Do not use MyChart messaging for urgent concerns.   DIET:  We do recommend a small meal at first, but then you may proceed to your regular diet.   Drink plenty of fluids but you should avoid alcoholic beverages for 24 hours.  ACTIVITY:  You should plan to take it easy for the rest of today and you should NOT DRIVE or use heavy machinery until tomorrow (because of the sedation medicines used during the test).    FOLLOW UP: Our staff will call the number listed on your records the next business day following your procedure.  We will call around 7:15- 8:00 am to check on you and address any questions or concerns that you may have regarding the information given to you following your procedure. If we do not reach you, we will leave a message.     If any biopsies were taken you will be contacted by phone or by letter within the next 1-3 weeks.  Please call us at [phone] if you have not heard about the biopsies in 3 weeks.   SIGNATURES/CONFIDENTIALITY: You and/or your care partner have signed paperwork which will be entered into your electronic medical record.  These signatures attest to the fact that that the information above on your After Visit Summary has been reviewed and is understood.  Full responsibility of the confidentiality of this discharge information lies with you and/or your care-partner.

## 2023-11-07 NOTE — Op Note (Signed)
[facility] Patient Name: Morgan Carter Procedure Date: 11/07/2023 10:56 AM MRN: [id number] Endoscopist: Wilhemina Bonito. Marina Goodell , MD, [id number] Age: 77 Referring MD:  Date of Birth: 10-23-1946 Gender: Female Account #: [id number] Procedure:                Colonoscopy with cold snare polypectomy x 2; biopsy                            polypectomy x 1 Indications:              Screening for colorectal malignant neoplasm.                            Reports colonoscopy on 2 prior occasions,                            elsewhere. Last one 10 years ago. No neoplasia. Medicines:                Monitored Anesthesia Care Procedure:                Pre-Anesthesia Assessment:                           - Prior to the procedure, a History and Physical                            was performed, and patient medications and                            allergies were reviewed. The patient's tolerance of                            previous anesthesia was also reviewed. The risks                            and benefits of the procedure and the sedation                            options and risks were discussed with the patient.                            All questions were answered, and informed consent                            was obtained. Prior Anticoagulants: The patient has                            taken no anticoagulant or antiplatelet agents.                            After reviewing the risks and benefits, the patient                            was deemed in satisfactory condition to undergo the  procedure.                           After obtaining informed consent, the colonoscope                            was passed under direct vision. Throughout the                            procedure, the patient's blood pressure, pulse, and                            oxygen saturations were monitored continuously. The                            CF [id number] #[id number] was  introduced through the anus                            and advanced to the the cecum, identified by                            appendiceal orifice and ileocecal valve. The                            ileocecal valve, appendiceal orifice, and rectum                            were photographed. The quality of the bowel                            preparation was good. The colonoscopy was performed                            without difficulty. The patient tolerated the                            procedure well. The bowel preparation used was                            SUPREP via split dose instruction. Scope In: 11:15:14 AM Scope Out: 11:36:43 AM Scope Withdrawal Time: 0 hours 14 minutes 54 seconds  Total Procedure Duration: 0 hours 21 minutes 29 seconds  Findings:                 Two polyps were found in the sigmoid colon and                            ascending colon. The polyps were 3 to 6 mm in size.                            These polyps were removed with a cold snare.                            Resection and retrieval were complete.  A 1 mm polyp was found in the descending colon. The                            polyp was removed with a jumbo cold forceps.                            Resection and retrieval were complete.                           A single diverticulum was found in the right colon.                           The exam was otherwise without abnormality on                            direct and retroflexion views. Complications:            No immediate complications. Estimated blood loss:                            None. Estimated Blood Loss:     Estimated blood loss: none. Impression:               - Two 3 to 6 mm polyps in the sigmoid colon and in                            the ascending colon, removed with a cold snare.                            Resected and retrieved.                           - One 1 mm polyp in the descending colon, removed                             with a jumbo cold forceps. Resected and retrieved.                           - Diverticulosis in the right colon.                           - The examination was otherwise normal on direct                            and retroflexion views. Recommendation:           - Repeat colonoscopy is not recommended for                            surveillance.                           - Patient has a contact number available for  emergencies. The signs and symptoms of potential                            delayed complications were discussed with the                            patient. Return to normal activities tomorrow.                            Written discharge instructions were provided to the                            patient.                           - Resume previous diet.                           - Continue present medications.                           - Await pathology results. Wilhemina Bonito. Marina Goodell, MD 11/07/2023 11:42:08 AM This report has been signed electronically.

## 2023-11-07 NOTE — Progress Notes (Signed)
Pt's states no medical or surgical changes since previsit or office visit. 

## 2023-11-07 NOTE — Progress Notes (Signed)
HISTORY OF PRESENT ILLNESS:  Morgan Carter is a 77 y.o. female   REVIEW OF SYSTEMS:  All non-GI ROS negative except for  Past Medical History:  Diagnosis Date   Hyperlipidemia    Hypertension     Past Surgical History:  Procedure Laterality Date   BREAST BIOPSY Left 1998   benign   BREAST BIOPSY Right 1992   benign   BREAST BIOPSY Right 1991   benign   BREAST BIOPSY Right 1990   benign   CESAREAN SECTION  11/10/1984   KNEE SURGERY  1992    Social History Morgan Carter  reports that she has quit smoking. Her smoking use included cigarettes. She has never used smokeless tobacco. She reports that she does not currently use alcohol. She reports that she does not use drugs.  family history includes Alcohol abuse in her daughter, paternal grandmother, sister, and son; Arthritis in her sister; Asthma in her mother, sister, and sister; COPD in her mother and sister; Depression in her daughter, sister, and son; Diabetes in her maternal grandfather and paternal grandmother; Drug abuse in her daughter, sister, and son; Early death in her father and maternal grandmother; Hearing loss in her paternal grandfather and sister; Heart disease in her mother; Hyperlipidemia in her son; Hypertension in her mother and son.  Allergies  Allergen Reactions   Shrimp (Diagnostic) Anaphylaxis   Cats Claw (Uncaria Tomentosa) Other (See Comments)    Itchy runny eye and cough       PHYSICAL EXAMINATION: Vital signs: BP (!) 159/95   Pulse 65   Temp 97.7 F (36.5 C)   Ht 5\' 4"  (1.626 m)   Wt 137 lb (62.1 kg)   SpO2 99%   BMI 23.52 kg/m  General: Well-developed, well-nourished, no acute distress HEENT: Sclerae are anicteric, conjunctiva pink. Oral mucosa intact Lungs: Clear Heart: Regular Abdomen: soft, nontender, nondistended, no obvious ascites, no peritoneal signs, normal bowel sounds. No organomegaly. Extremities: No edema Psychiatric: alert and oriented x3.  Cooperative     ASSESSMENT:     PLAN:

## 2023-11-07 NOTE — Progress Notes (Signed)
Report to PACU, RN, vss, BBS= Clear.  

## 2023-11-08 ENCOUNTER — Telehealth: Payer: Self-pay

## 2023-11-08 NOTE — Telephone Encounter (Signed)
  Follow up Call-     11/07/2023   10:27 AM  Call back number  Post procedure Call Back phone  # [phone]  Permission to leave phone message Yes     Patient questions:  Do you have a fever, pain , or abdominal swelling? No. Pain Score  0 *  Have you tolerated food without any problems? Yes.    Have you been able to return to your normal activities? Yes.    Do you have any questions about your discharge instructions: Diet   No. Medications  No. Follow up visit  No.  Do you have questions or concerns about your Care? No.  Actions: * If pain score is 4 or above: No action needed, pain <4.

## 2023-11-12 ENCOUNTER — Encounter: Payer: Self-pay | Admitting: Internal Medicine

## 2023-11-12 LAB — SURGICAL PATHOLOGY

## 2023-11-18 ENCOUNTER — Other Ambulatory Visit: Payer: Self-pay | Admitting: Family Medicine

## 2023-11-28 ENCOUNTER — Telehealth: Payer: Self-pay | Admitting: Internal Medicine

## 2023-11-28 NOTE — Telephone Encounter (Signed)
 Inbound call from patient stating she received a letter from her insurance stating the codes for November 26, 2023 colonoscopy are not valid at this time. Patient is requesting a call back to discuss further. Please advise, thank you.

## 2023-12-05 ENCOUNTER — Ambulatory Visit (INDEPENDENT_AMBULATORY_CARE_PROVIDER_SITE_OTHER): Payer: Medicare Other

## 2023-12-05 VITALS — Wt 137.0 lb

## 2023-12-05 DIAGNOSIS — Z0000 Encounter for general adult medical examination without abnormal findings: Secondary | ICD-10-CM | POA: Diagnosis not present

## 2023-12-05 NOTE — Patient Instructions (Signed)
 Morgan Carter , Thank you for taking time to come for your Medicare Wellness Visit. I appreciate your ongoing commitment to your health goals. Please review the following plan we discussed and let me know if I can assist you in the future.   Referrals/Orders/Follow-Ups/Clinician Recommendations: Aim for 30 minutes of exercise or brisk walking, 6-8 glasses of water, and 5 servings of fruits and vegetables each day.   Work on stamina for hiking a 60 mile trip with a high school friend     Aim for 30 minutes of exercise or brisk walking, 6-8 glasses of water, and 5 servings of fruits and vegetables each day.   This patient declined Interactive audio and acupuncturist. Therefore the visit was completed with audio only.   This is a list of the screening recommended for you and due dates:  Health Maintenance  Topic Date Due   Zoster (Shingles) Vaccine (1 of 2) Never done   Pneumonia Vaccine (1 of 2 - PCV) 08/18/2024*   Medicare Annual Wellness Visit  12/04/2024   DTaP/Tdap/Td vaccine (2 - Td or Tdap) 04/04/2032   Flu Shot  Completed   DEXA scan (bone density measurement)  Completed   COVID-19 Vaccine  Completed   Hepatitis C Screening  Completed   HPV Vaccine  Aged Out   Colon Cancer Screening  Discontinued  *Topic was postponed. The date shown is not the original due date.    Advanced directives: (Copy Requested) Please bring a copy of your health care power of attorney and living will to the office to be added to your chart at your convenience.  Next Medicare Annual Wellness Visit scheduled for next year: Yes

## 2023-12-05 NOTE — Progress Notes (Signed)
 Subjective:   Morgan Carter is a 78 y.o. female who presents for Medicare Annual (Subsequent) preventive examination.  Visit Complete: Virtual I connected with  Morgan Carter on 12/05/23 by a audio enabled telemedicine application and verified that I am speaking with the correct person using two identifiers.  Patient Location: Home  Provider Location: Office/Clinic  I discussed the limitations of evaluation and management by telemedicine. The patient expressed understanding and agreed to proceed.  Vital Signs: Because this visit was a virtual/telehealth visit, some criteria may be missing or patient reported. Any vitals not documented were not able to be obtained and vitals that have been documented are patient reported.  Cardiac Risk Factors include: advanced age (>59men, >67 women);dyslipidemia;hypertension     Objective:    Today's Vitals   12/05/23 1038  Weight: 137 lb (62.1 kg)   Body mass index is 23.52 kg/m.     12/05/2023   10:43 AM 09/19/2023   10:18 AM 10/23/2022   10:55 AM 12/14/2017    8:51 PM  Advanced Directives  Does Patient Have a Medical Advance Directive? Yes Yes Yes No  Type of Estate Agent of [place];Living will Healthcare Power of [place];Living will Healthcare Power of [place];Living will   Copy of Healthcare Power of Attorney in Chart? No - copy requested  No - copy requested   Would patient like information on creating a medical advance directive?    No - Patient declined    Current Medications (verified) Outpatient Encounter Medications as of 12/05/2023  Medication Sig   albuterol (VENTOLIN HFA) 108 (90 Base) MCG/ACT inhaler SMARTSIG:1 Puff(s) Via Inhaler Every 4 Hours PRN   ALPRAZolam  (XANAX ) 0.5 MG tablet TAKE 1/2 - 1 TABLET (0.25 - 0.5 MG TOTAL) BY MOUTH TWICE A DAY AS NEEDED FOR ANXIETY   aspirin 81 MG chewable tablet Chew by mouth daily.   B Complex-C (B-COMPLEX WITH VITAMIN C) tablet Take 1 tablet by mouth  daily.   calcium carbonate (OSCAL) 1500 (600 Ca) MG TABS tablet Take by mouth 2 (two) times daily with a meal.   cetirizine (ZYRTEC) 10 MG tablet Take 10 mg by mouth daily.   Cholecalciferol (VITAMIN D3) 75 MCG (3000 UT) TABS Take by mouth.   lisinopril (ZESTRIL) 20 MG tablet TAKE 1 TABLET BY MOUTH EVERY DAY   meloxicam  (MOBIC ) 15 MG tablet TAKE 1 TABLET (15 MG TOTAL) BY MOUTH DAILY.   Multiple Vitamins-Minerals (MULTIVITAMIN WITH MINERALS) tablet Take 1 tablet by mouth daily.   rosuvastatin (CRESTOR) 5 MG tablet TAKE 1 TABLET BY MOUTH EVERY OTHER DAY   zinc gluconate 50 MG tablet Take 50 mg by mouth daily.   No facility-administered encounter medications on file as of 12/05/2023.    Allergies (verified) Shrimp (diagnostic) and Cats claw (uncaria tomentosa)   History: Past Medical History:  Diagnosis Date   Hyperlipidemia    Hypertension    Past Surgical History:  Procedure Laterality Date   BREAST BIOPSY Left 1998   benign   BREAST BIOPSY Right 1992   benign   BREAST BIOPSY Right 1991   benign   BREAST BIOPSY Right 1990   benign   CESAREAN SECTION  11/10/1984   KNEE SURGERY  1992   Family History  Problem Relation Age of Onset   COPD Mother    Heart disease Mother    Asthma Mother    Hypertension Mother    Early death Father    Drug abuse Sister    Depression  Sister    COPD Sister    Asthma Sister    Arthritis Sister    Alcohol abuse Sister    Hearing loss Sister    Asthma Sister    Early death Maternal Grandmother    Diabetes Maternal Grandfather    Alcohol abuse Paternal Grandmother    Diabetes Paternal Grandmother    Hearing loss Paternal Grandfather    Drug abuse Daughter    Depression Daughter    Alcohol abuse Daughter    Hypertension Son    Hyperlipidemia Son    Drug abuse Son    Depression Son    Alcohol abuse Son    Colon cancer Neg Hx    Colon polyps Neg Hx    Esophageal cancer Neg Hx    Rectal cancer Neg Hx    Stomach cancer Neg Hx     Social History   Socioeconomic History   Marital status: Single    Spouse name: Not on file   Number of children: Not on file   Years of education: Not on file   Highest education level: Not on file  Occupational History   Not on file  Tobacco Use   Smoking status: Former    Types: Cigarettes   Smokeless tobacco: Never  Vaping Use   Vaping status: Never Used  Substance and Sexual Activity   Alcohol use: Not Currently   Drug use: Never   Sexual activity: Not on file  Other Topics Concern   Not on file  Social History Narrative   Not on file   Social Drivers of Health   Financial Resource Strain: Low Risk  (12/05/2023)   Overall Financial Resource Strain (CARDIA)    Difficulty of Paying Living Expenses: Not hard at all  Food Insecurity: No Food Insecurity (12/05/2023)   Hunger Vital Sign    Worried About Running Out of Food in the Last Year: Never true    Ran Out of Food in the Last Year: Never true  Transportation Needs: No Transportation Needs (12/05/2023)   PRAPARE - Administrator, Civil Service (Medical): No    Lack of Transportation (Non-Medical): No  Physical Activity: Sufficiently Active (12/05/2023)   Exercise Vital Sign    Days of Exercise per Week: 5 days    Minutes of Exercise per Session: 30 min  Stress: No Stress Concern Present (12/05/2023)   [employer] of Occupational Health - Occupational Stress Questionnaire    Feeling of Stress : Not at all  Social Connections: Moderately Isolated (12/05/2023)   Social Connection and Isolation Panel [NHANES]    Frequency of Communication with Friends and Family: More than three times a week    Frequency of Social Gatherings with Friends and Family: More than three times a week    Attends Religious Services: More than 4 times per year    Active Member of [employer] or Organizations: No    Attends Engineer, Structural: Never    Marital Status: Never married    Tobacco Counseling Counseling given:  Not Answered   Clinical Intake:  Pre-visit preparation completed: Yes  Pain : No/denies pain     BMI - recorded: 23.52 Nutritional Status: BMI of 19-24  Normal Nutritional Risks: None Diabetes: No  How often do you need to have someone help you when you read instructions, pamphlets, or other written materials from your doctor or pharmacy?: 1 - Never  Interpreter Needed?: No  Information entered by :: Ellouise Haws, LPN  Activities of Daily Living    12/05/2023   10:39 AM  In your present state of health, do you have any difficulty performing the following activities:  Hearing? 0  Vision? 0  Difficulty concentrating or making decisions? 0  Walking or climbing stairs? 0  Dressing or bathing? 0  Doing errands, shopping? 0  Preparing Food and eating ? N  Using the Toilet? N  In the past six months, have you accidently leaked urine? N  Do you have problems with loss of bowel control? N  Managing your Medications? N  Managing your Finances? N  Housekeeping or managing your Housekeeping? N    Patient Care Team: Kennyth Worth HERO, MD as PCP - General (Family Medicine)  Indicate any recent Medical Services you may have received from other than [facility] providers in the past year (date may be approximate).     Assessment:   This is a routine wellness examination for Morgan Carter.  Hearing/Vision screen Hearing Screening - Comments:: Pt denies any hearing issues  Vision Screening - Comments:: Pt follows up with Dr robinson for annual eye exams    Goals Addressed             This Visit's Progress    Patient Stated       Get in shape for a 60 mile hike        Depression Screen    12/05/2023   10:45 AM 08/19/2023    7:54 AM 03/06/2023    7:22 AM 10/23/2022   10:52 AM 09/10/2022   11:43 AM 09/03/2022    9:55 AM  PHQ 2/9 Scores  PHQ - 2 Score 0 1 0 0 0 0  PHQ- 9 Score  5        Fall Risk    12/05/2023   10:44 AM 08/19/2023    7:55 AM 03/06/2023    7:22 AM 10/23/2022    10:57 AM 09/10/2022   11:42 AM  Fall Risk   Falls in the past year? 0 1 0 1 0  Number falls in past yr: 0 0 0 1 0  Injury with Fall? 0 1 0 1 0  Comment    broke  right foot   Risk for fall due to : No Fall Risks No Fall Risks No Fall Risks Impaired vision No Fall Risks  Risk for fall due to: Comment    wears a boot denies any balance issues   Follow up Falls prevention discussed   Falls prevention discussed Falls evaluation completed    MEDICARE RISK AT HOME: Medicare Risk at Home Any stairs in or around the home?: No If so, are there any without handrails?: No Home free of loose throw rugs in walkways, pet beds, electrical cords, etc?: Yes Adequate lighting in your home to reduce risk of falls?: Yes Life alert?: No Use of a cane, walker or w/c?: No Grab bars in the bathroom?: No Shower chair or bench in shower?: No Elevated toilet seat or a handicapped toilet?: No  TIMED UP AND GO:  Was the test performed?  No    Cognitive Function:        12/05/2023   10:48 AM 10/23/2022   10:59 AM  6CIT Screen  What Year? 0 points 0 points  What month? 0 points 0 points  What time? 0 points 0 points  Count back from 20 0 points 0 points  Months in reverse 0 points 0 points  Repeat phrase 0 points 0 points  Total Score 0 points 0 points    Immunizations Immunization History  Administered Date(s) Administered   Fluad Quad(high Dose 65+) 08/30/2022   Fluad Trivalent(High Dose 65+) 09/17/2023   H1N1 11/05/2008   Hepatitis A 04/29/2007, 05/29/2007, 10/28/2007   Hepatitis B 04/29/2007, 05/29/2007, 11/03/2007   Influenza, Seasonal, Injecte, Preservative Fre 08/22/2011   Influenza,inj,Quad PF,6+ Mos 08/27/2019   Influenza-Unspecified 08/27/2019   Novel Infuenza-h1n1-09 11/05/2008   PFIZER(Purple Top)SARS-COV-2 Vaccination 12/17/2019, 01/07/2020, 08/30/2020, 05/01/2021   Pfizer Covid-19 Vaccine Bivalent Booster 94yrs & up 10/22/2021   Rsv, Bivalent, Protein Subunit Rsvpref,pf  [place]) 11/23/2023   Tdap 04/04/2022   Typhoid Live 04/29/2007   Unspecified SARS-COV-2 Vaccination 10/09/2022   Yellow Fever 10/28/2007    TDAP status: Up to date  Flu Vaccine status: Up to date  Pneumococcal vaccine status: Due, Education has been provided regarding the importance of this vaccine. Advised may receive this vaccine at local pharmacy or Health Dept. Aware to provide a copy of the vaccination record if obtained from local pharmacy or Health Dept. Verbalized acceptance and understanding.  Covid-19 vaccine status: Completed vaccines  Qualifies for Shingles Vaccine? Yes   Zostavax completed No   Shingrix Completed?: No.    Education has been provided regarding the importance of this vaccine. Patient has been advised to call insurance company to determine out of pocket expense if they have not yet received this vaccine. Advised may also receive vaccine at local pharmacy or Health Dept. Verbalized acceptance and understanding.  Screening Tests Health Maintenance  Topic Date Due   Zoster Vaccines- Shingrix (1 of 2) Never done   Pneumonia Vaccine 68+ Years old (1 of 2 - PCV) 08/18/2024 (Originally 11/14/1952)   Medicare Annual Wellness (AWV)  12/04/2024   DTaP/Tdap/Td (2 - Td or Tdap) 04/04/2032   INFLUENZA VACCINE  Completed   DEXA SCAN  Completed   COVID-19 Vaccine  Completed   Hepatitis C Screening  Completed   HPV VACCINES  Aged Out   Colonoscopy  Discontinued    Health Maintenance  Health Maintenance Due  Topic Date Due   Zoster Vaccines- Shingrix (1 of 2) Never done    Colorectal cancer screening: No longer required.   Mammogram status: Completed 09/11/23. Repeat every year  Bone Density status: Completed 07/07/18. Results reflect: Bone density results: OSTEOPENIA. Repeat every 2 years.   Additional Screening:  Hepatitis C Screening: Completed 04/10/18  Vision Screening: Recommended annual ophthalmology exams for early detection of glaucoma and other  disorders of the eye. Is the patient up to date with their annual eye exam?  Yes  Who is the provider or what is the name of the office in which the patient attends annual eye exams? Dr Robinson  If pt is not established with a provider, would they like to be referred to a provider to establish care? No .   Dental Screening: Recommended annual dental exams for proper oral hygiene  Community Resource Referral / Chronic Care Management: CRR required this visit?  No   CCM required this visit?  No     Plan:     I have personally reviewed and noted the following in the patient's chart:   Medical and social history Use of alcohol, tobacco or illicit drugs  Current medications and supplements including opioid prescriptions. Patient is not currently taking opioid prescriptions. Functional ability and status Nutritional status Physical activity Advanced directives List of other physicians Hospitalizations, surgeries, and ER visits in previous 12 months Vitals Screenings to include cognitive, depression,  and falls Referrals and appointments  In addition, I have reviewed and discussed with patient certain preventive protocols, quality metrics, and best practice recommendations. A written personalized care plan for preventive services as well as general preventive health recommendations were provided to patient.     Ellouise VEAR Haws, LPN   [id number]/[id number]   After Visit Summary: (MyChart) Due to this being a telephonic visit, the after visit summary with patients personalized plan was offered to patient via MyChart   Nurse Notes: pt preference telephonic AWV

## 2023-12-16 ENCOUNTER — Other Ambulatory Visit: Payer: Self-pay | Admitting: Family Medicine

## 2024-01-14 ENCOUNTER — Other Ambulatory Visit: Payer: Self-pay | Admitting: Family Medicine

## 2024-01-15 ENCOUNTER — Other Ambulatory Visit: Payer: Self-pay | Admitting: Family Medicine

## 2024-01-15 DIAGNOSIS — I10 Essential (primary) hypertension: Secondary | ICD-10-CM

## 2024-01-19 ENCOUNTER — Other Ambulatory Visit: Payer: Self-pay | Admitting: Family Medicine

## 2024-01-19 DIAGNOSIS — E7800 Pure hypercholesterolemia, unspecified: Secondary | ICD-10-CM

## 2024-02-11 ENCOUNTER — Encounter (HOSPITAL_BASED_OUTPATIENT_CLINIC_OR_DEPARTMENT_OTHER): Payer: Self-pay | Admitting: Emergency Medicine

## 2024-02-11 ENCOUNTER — Emergency Department (HOSPITAL_BASED_OUTPATIENT_CLINIC_OR_DEPARTMENT_OTHER)

## 2024-02-11 ENCOUNTER — Other Ambulatory Visit: Payer: Self-pay

## 2024-02-11 ENCOUNTER — Emergency Department (HOSPITAL_BASED_OUTPATIENT_CLINIC_OR_DEPARTMENT_OTHER)
Admission: EM | Admit: 2024-02-11 | Discharge: 2024-02-11 | Disposition: A | Discharge status: Home / Self Care | Attending: Emergency Medicine | Admitting: Emergency Medicine

## 2024-02-11 DIAGNOSIS — W228XXA Striking against or struck by other objects, initial encounter: Secondary | ICD-10-CM | POA: Insufficient documentation

## 2024-02-11 DIAGNOSIS — S0101XA Laceration without foreign body of scalp, initial encounter: Secondary | ICD-10-CM | POA: Insufficient documentation

## 2024-02-11 DIAGNOSIS — Z7982 Long term (current) use of aspirin: Secondary | ICD-10-CM | POA: Insufficient documentation

## 2024-02-11 DIAGNOSIS — I10 Essential (primary) hypertension: Secondary | ICD-10-CM | POA: Diagnosis not present

## 2024-02-11 DIAGNOSIS — S0990XA Unspecified injury of head, initial encounter: Secondary | ICD-10-CM | POA: Diagnosis not present

## 2024-02-11 MED ORDER — CEPHALEXIN 500 MG PO CAPS: 500.0000 mg | ORAL_CAPSULE | Freq: Four times a day (QID) | ORAL | 0 refills | Status: AC

## 2024-02-11 MED ORDER — CEPHALEXIN 250 MG PO CAPS
500.0000 mg | ORAL_CAPSULE | Freq: Once | ORAL | Status: AC
  Administered 2024-02-11: 500 mg via ORAL
  Filled 2024-02-11: qty 2

## 2024-02-11 MED ORDER — LIDOCAINE-EPINEPHRINE (PF) 2 %-1:200000 IJ SOLN
20.0000 mL | Freq: Once | INTRAMUSCULAR | Status: DC
  Filled 2024-02-11: qty 20

## 2024-02-11 NOTE — ED Provider Notes (Signed)
 [place] EMERGENCY DEPARTMENT AT [facility] Provider Note   CSN: [id number] Arrival date & time: 02/11/24  0272     History  Chief Complaint  Patient presents with   Head Laceration    Morgan Carter is a 78 y.o. female with a history of hypertension who presents the ED today after a head injury.  Patient reports that she was going hiking around 9 AM when she ducked her head to walk under a branch when she hit her head on it and started bleeding.  She denies any loss of consciousness or falls.  She denies taking blood thinners but does take a baby aspirin daily.  Denies any pain at the time of evaluation.  No vision changes, weakness, slurred speech, or confusion.  Tetanus vaccination is up-to-date.    Home Medications Prior to Admission medications   Medication Sig Start Date End Date Taking? Authorizing Provider  cephALEXin (KEFLEX) 500 MG capsule Take 1 capsule (500 mg total) by mouth 4 (four) times daily for 7 days. 02/11/24 02/18/24 Yes Maxwell Marion, PA-C  albuterol (VENTOLIN HFA) 108 (90 Base) MCG/ACT inhaler SMARTSIG:1 Puff(s) Via Inhaler Every 4 Hours PRN 04/27/22   [provider]  ALPRAZolam (XANAX) 0.5 MG tablet TAKE 1/2 - 1 TABLET (0.25 - 0.5 MG TOTAL) BY MOUTH TWICE A DAY AS NEEDED FOR ANXIETY 09/26/23   Ardith Dark, MD  aspirin 81 MG chewable tablet Chew by mouth daily.    [provider]  B Complex-C (B-COMPLEX WITH VITAMIN C) tablet Take 1 tablet by mouth daily.    [provider]  calcium carbonate (OSCAL) 1500 (600 Ca) MG TABS tablet Take by mouth 2 (two) times daily with a meal.    [provider]  cetirizine (ZYRTEC) 10 MG tablet Take 10 mg by mouth daily.    [provider]  Cholecalciferol (VITAMIN D3) 75 MCG (3000 UT) TABS Take by mouth.    [provider]  lisinopril (ZESTRIL) 20 MG tablet TAKE 1 TABLET BY MOUTH EVERY DAY 01/15/24   Ardith Dark, MD  meloxicam (MOBIC) 15 MG tablet TAKE 1  TABLET (15 MG TOTAL) BY MOUTH DAILY. 01/14/24   Ardith Dark, MD  Multiple Vitamins-Minerals (MULTIVITAMIN WITH MINERALS) tablet Take 1 tablet by mouth daily.    [provider]  rosuvastatin (CRESTOR) 5 MG tablet TAKE 1 TABLET BY MOUTH EVERY OTHER DAY 01/20/24   Ardith Dark, MD  zinc gluconate 50 MG tablet Take 50 mg by mouth daily.    [provider]      Allergies    Shrimp (diagnostic) and Cats claw (uncaria tomentosa)    Review of Systems   Review of Systems  Skin:  Positive for wound.  All other systems reviewed and are negative.   Physical Exam Updated Vital Signs BP (!) 185/98   Pulse 75   Temp 98.7 F (37.1 C)   Resp 18   Ht 5\' 5"  (1.651 m)   Wt 61.2 kg   SpO2 99%   BMI 22.47 kg/m  Physical Exam Vitals and nursing note reviewed.  Constitutional:      Appearance: Normal appearance.  HENT:     Head: Normocephalic.     Comments: L shape laceration to the scalp    Mouth/Throat:     Mouth: Mucous membranes are moist.  Eyes:     Conjunctiva/sclera: Conjunctivae normal.     Pupils: Pupils are equal, round, and reactive to light.  Cardiovascular:  Rate and Rhythm: Normal rate and regular rhythm.     Pulses: Normal pulses.  Pulmonary:     Effort: Pulmonary effort is normal.     Breath sounds: Normal breath sounds.  Abdominal:     Palpations: Abdomen is soft.     Tenderness: There is no abdominal tenderness.  Skin:    General: Skin is warm and dry.     Findings: No rash.  Neurological:     General: No focal deficit present.     Mental Status: She is alert.  Psychiatric:        Mood and Affect: Mood normal.        Behavior: Behavior normal.    ED Results / Procedures / Treatments   Labs (all labs ordered are listed, but only abnormal results are displayed) Labs Reviewed - No data to display  EKG None  Radiology CT Head Wo Contrast Result Date: 02/11/2024 CLINICAL DATA:  Head trauma, minor. Sharp object entered the head,  described as a thorn. EXAM: CT HEAD WITHOUT CONTRAST TECHNIQUE: Contiguous axial images were obtained from the base of the skull through the vertex without intravenous contrast. RADIATION DOSE REDUCTION: This exam was performed according to the departmental dose-optimization program which includes automated exposure control, adjustment of the mA and/or kV according to patient size and/or use of iterative reconstruction technique. COMPARISON:  12/15/2017 FINDINGS: Brain: The brain itself has normal appearance without evidence of old or acute infarction, mass lesion, hemorrhage, hydrocephalus or extra-axial collection. Vascular: No abnormal vascular finding. Skull: No skull fracture. Chronic benign prominence of the outer table in the right frontal region. Sinuses/Orbits: Clear/normal Other: Evaluation of the soft tissues of the scalp does not show evidence of a radiodense foreign object. There is an obvious scalp injury at the left parietal vertex. Soft tissue swelling in the region. There are air bubbles related to the laceration/penetrating injury. The air in the soft tissues shows rounded configuration and I do not see any linear air or angular collections that might suggest the presence of residual plant material. No hyperdense finding in the region. A few tiny skin calcifications are scattered about and or chronic as seen previously. IMPRESSION: 1. Normal appearance of the brain itself. No skull fracture. 2. Scalp injury at the left parietal vertex. Soft tissue swelling in the region. Air bubbles related to the laceration/penetrating injury. No linear air or angular air collections that might suggest the presence of residual plant material. No hyperdense finding in the region. Electronically Signed   By: Paulina Fusi M.D.   On: 02/11/2024 11:22    Procedures .Laceration Repair  Date/Time: 02/11/2024 12:51 PM  Performed by: Maxwell Marion, PA-C Authorized by: Maxwell Marion, PA-C   Laceration details:     Location:  Scalp   Scalp location:  Crown   Length (cm):  12 Exploration:    Hemostasis achieved with:  Epinephrine   Imaging outcome: foreign body not noted   Treatment:    Area cleansed with:  Saline   Amount of cleaning:  Extensive   Irrigation method:  Syringe Skin repair:    Repair method:  Sutures and staples   Suture size:  3-0   Suture material:  Prolene   Number of sutures:  6   Number of staples:  5 Approximation:    Approximation:  Close Repair type:    Repair type:  Intermediate Post-procedure details:    Procedure completion:  Tolerated well, no immediate complications     Medications Ordered in  ED Medications  lidocaine-EPINEPHrine (XYLOCAINE W/EPI) 2 %-1:200000 (PF) injection 20 mL (has no administration in time range)  cephALEXin (KEFLEX) capsule 500 mg (500 mg Oral Given 02/11/24 1301)    ED Course/ Medical Decision Making/ A&P                                 Medical Decision Making Amount and/or Complexity of Data Reviewed Radiology: ordered.  Risk Prescription drug management.   This patient presents to the ED for concern of head injury, this involves an extensive number of treatment options, and is a complaint that carries with it a high risk of complications and morbidity.   Differential diagnosis includes: Skull fracture, concussion, hematoma, contusion, abrasion, laceration, etc.   Comorbidities  See HPI above   Additional History  Additional history obtained from prior records   Imaging Studies  I ordered imaging studies including CT head I independently visualized and interpreted imaging which showed:  Normal appearance of the brain.  No skull fracture. Scalp injury at the left parietal vertex.  Soft tissue swelling in the region.  No presence of residual plate material.  No hyperdense finding in the region. I agree with the radiologist interpretation   Problem List / ED Course / Critical Interventions / Medication  Management  Patient was going hiking today when she ducked over a large branch which cut her scalp.  She has a large V shaped laceration to the top of the scalp.  Denies any loss of consciousness or falling.  No blood thinners.  She denies any pain without my evaluation.  Low suspicion for intracranial bleed. Tetanus was last updated in 2023. Due to this after the laceration being wide sutures had to be placed inside the staples.  Staples were placed on the outer aspects of the laceration.  Since the wound is deep, will start patient prophylactic antibiotics to prevent infection. I ordered medications including: Keflex for antibiotic therapy - medication given prior to discharge Discussed findings with patient and family ember at bedside.  All questions were answered. Instructions for laceration care given.   Social Determinants of Health  Physical activity   Test / Admission - Considered  Patient is hemodynamically stable and safe for discharge home. Strict return precautions given.  Advise close primary care follow-up.       Final Clinical Impression(s) / ED Diagnoses Final diagnoses:  Laceration of scalp, initial encounter    Rx / DC Orders ED Discharge Orders          Ordered    cephALEXin (KEFLEX) 500 MG capsule  4 times daily        02/11/24 1248              Maxwell Marion, PA-C 02/11/24 1606    Vanetta Mulders, MD 02/14/24 [phone]

## 2024-02-11 NOTE — Discharge Instructions (Addendum)
 Staples and stitches need to come out in 7 to 10 days.  This can be done by your primary care, at an urgent care, or at the ED. Clean the wound site with soap and water to prevent crusting over the suture knots. Do not go into pools, lakes, or oceans until the sutures/staples are removed in order to prevent infection.   Take Keflex 4 times a day for the next 7 days to prevent infection.  First dose was given today in the ED.  If you have any headaches you can alternate between Ibuprofen 600 mg and Tylenol 500 mg every 4 hours as needed.  Get help right away if: You have sudden: Headache that is very bad. Vomiting that does not stop. Changes in the size of one of your pupils. Pupils are the black centers of your eyes. Changes in how you see (vision). More confusion or more grumpy moods. You have a seizure. Your symptoms get worse. You have a clear or bloody fluid coming from your nose or ears.

## 2024-02-11 NOTE — ED Triage Notes (Signed)
 Pt caox4, ambulatory, NAD stating around 9a she was hiking when she ducked her head and felt a thorn go into her head. Pt denies falling or hitting her head. Pt does not take blood thinner. Pt denies LOC. Denies pain at present. No active bleeding.

## 2024-02-11 NOTE — ED Notes (Signed)
 ED Provider at bedside.

## 2024-02-13 DIAGNOSIS — Z1152 Encounter for screening for COVID-19: Secondary | ICD-10-CM | POA: Diagnosis not present

## 2024-02-15 ENCOUNTER — Ambulatory Visit (HOSPITAL_COMMUNITY)
Admission: EM | Admit: 2024-02-15 | Discharge: 2024-02-15 | Disposition: A | Discharge status: Home / Self Care | Attending: Physician Assistant | Admitting: Physician Assistant

## 2024-02-15 ENCOUNTER — Other Ambulatory Visit: Payer: Self-pay

## 2024-02-15 ENCOUNTER — Encounter (HOSPITAL_COMMUNITY): Payer: Self-pay | Admitting: Emergency Medicine

## 2024-02-15 DIAGNOSIS — U071 COVID-19: Secondary | ICD-10-CM | POA: Diagnosis not present

## 2024-02-15 DIAGNOSIS — S0101XD Laceration without foreign body of scalp, subsequent encounter: Secondary | ICD-10-CM

## 2024-02-15 MED ORDER — PAXLOVID (300/100) 20 X 150 MG & 10 X 100MG PO TBPK: 3.0000 | ORAL_TABLET | Freq: Two times a day (BID) | ORAL | 0 refills | Status: AC

## 2024-02-15 NOTE — Discharge Instructions (Signed)
 Good to meet you today.  Please take the Paxlovid as directed.  Hold your cholesterol medicine while you are taking the Paxlovid.  Stay well-hydrated and rest.  Follow-up with your primary care if any change or concerning symptoms.  ER if acute severe sudden changes.  Follow-up with your PCP in the middle of next week to have your sutures and staples removed from the emergency department.

## 2024-02-15 NOTE — ED Triage Notes (Signed)
 Pt states she is tested covid + at home and is requesting treatment. No cold like symptoms at this time.

## 2024-02-15 NOTE — ED Provider Notes (Signed)
 [facility]   MRN: [id number] DOB: 10/23/1946  Subjective:   Morgan Carter is a 78 y.o. female presenting for prescription for Paxlovid.  Patient reports that she tested positive for COVID-19 this morning.  Her son tested +2 days ago, which has prompted her testing.  She felt more tired than normal yesterday and this morning, which prompted her testing as well.  She reports that she has had COVID-19 several times in the past and has always done well with Paxlovid.  She does not want to chance having worsening symptoms at her age.  She has had all of her COVID-19 vaccines.  She denies any fever or chills.  No shortness of breath or chest pain.  She does have a head injury from 02/11/2024 and would like her sutures and staples examined today.  No dizziness or changes in cognition.  No current facility-administered medications for this encounter.  Current Outpatient Medications:    nirmatrelvir/ritonavir (PAXLOVID, 300/100,) 20 x 150 MG & 10 x 100MG  TBPK, Take 3 tablets by mouth 2 (two) times daily for 5 days. Patient GFR is 74.88. Take nirmatrelvir (150 mg) two tablets twice daily for 5 days and ritonavir (100 mg) one tablet twice daily for 5 days., Disp: 30 tablet, Rfl: 0   albuterol (VENTOLIN HFA) 108 (90 Base) MCG/ACT inhaler, SMARTSIG:1 Puff(s) Via Inhaler Every 4 Hours PRN, Disp: , Rfl:    ALPRAZolam (XANAX) 0.5 MG tablet, TAKE 1/2 - 1 TABLET (0.25 - 0.5 MG TOTAL) BY MOUTH TWICE A DAY AS NEEDED FOR ANXIETY, Disp: 30 tablet, Rfl: 5   aspirin 81 MG chewable tablet, Chew by mouth daily., Disp: , Rfl:    B Complex-C (B-COMPLEX WITH VITAMIN C) tablet, Take 1 tablet by mouth daily., Disp: , Rfl:    calcium carbonate (OSCAL) 1500 (600 Ca) MG TABS tablet, Take by mouth 2 (two) times daily with a meal., Disp: , Rfl:    cephALEXin (KEFLEX) 500 MG capsule, Take 1 capsule (500 mg total) by mouth 4 (four) times daily for 7 days., Disp: 28 capsule, Rfl: 0   cetirizine (ZYRTEC) 10 MG  tablet, Take 10 mg by mouth daily., Disp: , Rfl:    Cholecalciferol (VITAMIN D3) 75 MCG (3000 UT) TABS, Take by mouth., Disp: , Rfl:    lisinopril (ZESTRIL) 20 MG tablet, TAKE 1 TABLET BY MOUTH EVERY DAY, Disp: 90 tablet, Rfl: 3   meloxicam (MOBIC) 15 MG tablet, TAKE 1 TABLET (15 MG TOTAL) BY MOUTH DAILY., Disp: 30 tablet, Rfl: 0   Multiple Vitamins-Minerals (MULTIVITAMIN WITH MINERALS) tablet, Take 1 tablet by mouth daily., Disp: , Rfl:    rosuvastatin (CRESTOR) 5 MG tablet, TAKE 1 TABLET BY MOUTH EVERY OTHER DAY, Disp: 45 tablet, Rfl: 1   zinc gluconate 50 MG tablet, Take 50 mg by mouth daily., Disp: , Rfl:    Allergies  Allergen Reactions   Shrimp (Diagnostic) Anaphylaxis   Cats Claw (Uncaria [place]osa) Other (See Comments)    Itchy runny eye and cough    Past Medical History:  Diagnosis Date   Hyperlipidemia    Hypertension      Past Surgical History:  Procedure Laterality Date   BREAST BIOPSY Left 1998   benign   BREAST BIOPSY Right 1992   benign   BREAST BIOPSY Right 1991   benign   BREAST BIOPSY Right 1990   benign   CESAREAN SECTION  11/10/1984   KNEE SURGERY  1992    Family History  Problem Relation Age of Onset   COPD Mother    Heart disease Mother    Asthma Mother    Hypertension Mother    Early death Father    Drug abuse Sister    Depression Sister    COPD Sister    Asthma Sister    Arthritis Sister    Alcohol abuse Sister    Hearing loss Sister    Asthma Sister    Early death Maternal Grandmother    Diabetes Maternal Grandfather    Alcohol abuse Paternal Grandmother    Diabetes Paternal Grandmother    Hearing loss Paternal Grandfather    Drug abuse Daughter    Depression Daughter    Alcohol abuse Daughter    Hypertension Son    Hyperlipidemia Son    Drug abuse Son    Depression Son    Alcohol abuse Son    Colon cancer Neg Hx    Colon polyps Neg Hx    Esophageal cancer Neg Hx    Rectal cancer Neg Hx    Stomach cancer Neg Hx     Social  History   Tobacco Use   Smoking status: Former    Types: Cigarettes   Smokeless tobacco: Never  Vaping Use   Vaping status: Never Used  Substance Use Topics   Alcohol use: Not Currently   Drug use: Never    ROS REFER TO HPI FOR PERTINENT POSITIVES AND NEGATIVES   Objective:   Vitals: BP (!) 160/82 (BP Location: Right Arm)   Pulse 62   Temp 97.8 F (36.6 C) (Oral)   Resp 18   SpO2 98%   Physical Exam Vitals and nursing note reviewed.  Constitutional:      General: She is not in acute distress.    Appearance: Normal appearance. She is not ill-appearing.  HENT:     Head: Normocephalic.     Right Ear: Tympanic membrane, ear canal and external ear normal.     Left Ear: Tympanic membrane, ear canal and external ear normal.     Nose: No congestion.     Mouth/Throat:     Mouth: Mucous membranes are moist.     Pharynx: No oropharyngeal exudate or posterior oropharyngeal erythema.  Eyes:     Extraocular Movements: Extraocular movements intact.     Conjunctiva/sclera: Conjunctivae normal.     Pupils: Pupils are equal, round, and reactive to light.  Cardiovascular:     Rate and Rhythm: Normal rate and regular rhythm.     Pulses: Normal pulses.     Heart sounds: Normal heart sounds. No murmur heard. Pulmonary:     Effort: Pulmonary effort is normal. No respiratory distress.     Breath sounds: Normal breath sounds. No wheezing.  Musculoskeletal:     Cervical back: Normal range of motion.  Skin:    General: Skin is warm.     Comments: Laceration of scalp, currently healing well with intact sutures and staples.  No surrounding erythema or edema.  No signs of infection.  Neurological:     Mental Status: She is alert and oriented to person, place, and time.  Psychiatric:        Mood and Affect: Mood normal.        Behavior: Behavior normal.     No results found for this or any previous visit (from the past 24 hours).  Assessment and Plan :   PDMP not reviewed this  encounter.  1. COVID-19   2. Laceration of  scalp without foreign body, subsequent encounter    78 year old female patient with COVID-19 positive test at home this morning.  Did not repeat testing in the urgent care today with his history, as well as a son who is also positive for COVID-19.  Plan to treat with Paxlovid as patient requested.  She knows to hold her cholesterol medicine while taking the Paxlovid. Pt aware of risks vs benefits and possible adverse reactions.  Isolation precautions discussed with patient.  Return to care precautions discussed with her.  Reassured patient that her laceration appears to be healing well and has no signs of infection.  She is going to follow-up with her PCP later this week to have sutures and staples removed.    AllwardtCrist Infante, PA-C 02/15/24 1116

## 2024-02-21 ENCOUNTER — Ambulatory Visit (INDEPENDENT_AMBULATORY_CARE_PROVIDER_SITE_OTHER): Admitting: Family Medicine

## 2024-02-21 VITALS — BP 138/76 | HR 71 | Temp 97.5°F | Ht 65.0 in | Wt 143.2 lb

## 2024-02-21 DIAGNOSIS — F419 Anxiety disorder, unspecified: Secondary | ICD-10-CM | POA: Diagnosis not present

## 2024-02-21 DIAGNOSIS — U071 COVID-19: Secondary | ICD-10-CM

## 2024-02-21 DIAGNOSIS — S0101XD Laceration without foreign body of scalp, subsequent encounter: Secondary | ICD-10-CM

## 2024-02-21 DIAGNOSIS — I10 Essential (primary) hypertension: Secondary | ICD-10-CM | POA: Diagnosis not present

## 2024-02-21 MED ORDER — ALPRAZOLAM 0.5 MG PO TABS: 0.5000 mg | ORAL_TABLET | Freq: Two times a day (BID) | ORAL | 5 refills | Status: DC | PRN

## 2024-02-21 NOTE — Assessment & Plan Note (Signed)
At goal today on lisinopril 20 mg daily.

## 2024-02-21 NOTE — Assessment & Plan Note (Signed)
 Overall symptoms are manageable.  He is a Xanax as needed which works well.  Still has a lot of stress related to her grandchild's custody and health of her son however symptoms are manageable.  Will refill her alprazolam today.

## 2024-02-21 NOTE — Patient Instructions (Addendum)
 It was very nice to see you today!  We removed your staples and sutures today.  It is okay for you to resume your normal hygiene activities.  Will see back soon for your physical.  Please come back sooner if needed.  Return if symptoms worsen or fail to improve.   Take care, Dr Jimmey Ralph  PLEASE NOTE:  If you had any lab tests, please let us know if you have not heard back within a few days. You may see your results on mychart before we have a chance to review them but we will give you a call once they are reviewed by [place].   If we ordered any referrals today, please let us know if you have not heard from their office within the next week.   If you had any urgent prescriptions sent in today, please check with the pharmacy within an hour of our visit to make sure the prescription was transmitted appropriately.   Please try these tips to maintain a healthy lifestyle:  Eat at least 3 REAL meals and 1-2 snacks per day.  Aim for no more than 5 hours between eating.  If you eat breakfast, please do so within one hour of getting up.   Each meal should contain half fruits/vegetables, one quarter protein, and one quarter carbs (no bigger than a computer mouse)  Cut down on sweet beverages. This includes juice, soda, and sweet tea.   Drink at least 1 glass of water with each meal and aim for at least 8 glasses per day  Exercise at least 150 minutes every week.

## 2024-02-21 NOTE — Progress Notes (Signed)
   Morgan Carter is a 78 y.o. female who presents today for an office visit.  Assessment/Plan:  New/Acute Problems: Scalp laceration Will heal.  No signs of infection.  Staples and sutures removed today without difficulty.  See below procedure note.  She tolerated well.  COVID Normal exam today.  Symptoms have improved significantly.  She will let us know if symptoms do not continue to improve.  Chronic Problems Addressed Today: Anxiety Overall symptoms are manageable.  He is a Xanax as needed which works well.  Still has a lot of stress related to her grandchild's custody and health of her son however symptoms are manageable.  Will refill her alprazolam today.  Essential hypertension At goal today on lisinopril 20 mg daily.     Subjective:  HPI:  See A/P for status of chronic conditions.  Patient is here today for ED follow-up.  She went to the ED 10 days ago after suffering a laceration on her scalp.  Patient was hiking when she ducked under a tree which created laceration on her posterior scalp.  In the ED her laceration was repaired with 6 sutures and 5 staples.  She was also started on prophylactic antibiotics given the deep nature of her laceration with Keflex. She has done well since being home. She has had some mild tenderness but this is manageable. No discharge.   4 days later she went to urgent care after testing positive for COVID.Marland Kitchen  She was started on Paxlovid. She has finished her course of this as well. Her symptoms have improved. Still some mild fatigue. No chest pain or shortness of breath.        Objective:  Physical Exam: BP 138/76   Pulse 71   Temp (!) 97.5 F (36.4 C) (Temporal)   Ht 5\' 5"  (1.651 m)   Wt 143 lb 3.2 oz (65 kg)   SpO2 98%   BMI 23.83 kg/m   Gen: No acute distress, resting comfortably CV: Regular rate and rhythm with no murmurs appreciated Pulm: Normal work of breathing, clear to auscultation bilaterally with no crackles, wheezes, or  rhonchi Neuro: Grossly normal, moves all extremities Psych: Normal affect and thought content  Procedure note Verbal consent was obtained.  Her sutures and staples were removed without complication.  Time Spent: 45 minutes of total time was spent on the date of the encounter performing the following actions: chart review prior to seeing the patient, obtaining history, performing a medically necessary exam, removal of staples and sutures, counseling on the treatment plan, placing orders, and documenting in our EHR.        Katina Degree. Jimmey Ralph, MD 02/21/2024 11:08 AM

## 2024-03-09 ENCOUNTER — Encounter: Payer: Medicare Other | Admitting: Family Medicine

## 2024-03-10 ENCOUNTER — Ambulatory Visit: Payer: Self-pay

## 2024-03-10 ENCOUNTER — Ambulatory Visit (INDEPENDENT_AMBULATORY_CARE_PROVIDER_SITE_OTHER): Admitting: Physician Assistant

## 2024-03-10 ENCOUNTER — Encounter: Payer: Self-pay | Admitting: Physician Assistant

## 2024-03-10 VITALS — BP 150/80 | HR 74 | Temp 98.2°F | Ht 65.0 in | Wt 146.2 lb

## 2024-03-10 DIAGNOSIS — J029 Acute pharyngitis, unspecified: Secondary | ICD-10-CM

## 2024-03-10 LAB — POCT INFLUENZA A/B
Influenza A, POC: NEGATIVE
Influenza B, POC: NEGATIVE

## 2024-03-10 LAB — POCT RAPID STREP A (OFFICE): Rapid Strep A Screen: NEGATIVE

## 2024-03-10 LAB — POC COVID19 BINAXNOW: SARS Coronavirus 2 Ag: NEGATIVE

## 2024-03-10 NOTE — Progress Notes (Signed)
 Morgan Carter is a 78 y.o. female here for a new problem.  History of Present Illness:   Chief Complaint  Patient presents with   Sore Throat    Pt c/o sore throat, headache, body aches, started on Sunday, yesterday chills and fever 101.     HPI  Sore throat: Pt complains of a sore throat, body aches, headaches, and chills, starting Sunday 4/13.  She reports a fever of 101 yesterday.  She has only taken Tylenol and Ibuprofen.  She occasionally uses her inhaler to manage her asthma but has not required frequent use recently.  Of note, her grandson who lives with her was recently diagnosed with hand, mouth, and foot disease. His symptoms started on Thursday 4/10.  She has not noticed any spots appear on her skin.  Denies any diarrhea or other GI symptoms.  Denies any urinary symptoms. No hx of recurrent UTIs or urinary symptoms.   Past Medical History:  Diagnosis Date   Hyperlipidemia    Hypertension      Social History   Tobacco Use   Smoking status: Former    Types: Cigarettes   Smokeless tobacco: Never  Vaping Use   Vaping status: Never Used  Substance Use Topics   Alcohol use: Not Currently   Drug use: Never    Past Surgical History:  Procedure Laterality Date   BREAST BIOPSY Left 1998   benign   BREAST BIOPSY Right 1992   benign   BREAST BIOPSY Right 1991   benign   BREAST BIOPSY Right 1990   benign   CESAREAN SECTION  11/10/1984   KNEE SURGERY  1992    Family History  Problem Relation Age of Onset   COPD Mother    Heart disease Mother    Asthma Mother    Hypertension Mother    Early death Father    Drug abuse Sister    Depression Sister    COPD Sister    Asthma Sister    Arthritis Sister    Alcohol abuse Sister    Hearing loss Sister    Asthma Sister    Early death Maternal Grandmother    Diabetes Maternal Grandfather    Alcohol abuse Paternal Grandmother    Diabetes Paternal Grandmother    Hearing loss Paternal Grandfather    Drug  abuse Daughter    Depression Daughter    Alcohol abuse Daughter    Hypertension Son    Hyperlipidemia Son    Drug abuse Son    Depression Son    Alcohol abuse Son    Colon cancer Neg Hx    Colon polyps Neg Hx    Esophageal cancer Neg Hx    Rectal cancer Neg Hx    Stomach cancer Neg Hx     Allergies  Allergen Reactions   Shrimp (Diagnostic) Anaphylaxis   Cats Claw (Uncaria Tomentosa) Other (See Comments)    Itchy runny eye and cough    Current Medications:   Current Outpatient Medications:    albuterol (VENTOLIN HFA) 108 (90 Base) MCG/ACT inhaler, SMARTSIG:1 Puff(s) Via Inhaler Every 4 Hours PRN, Disp: , Rfl:    ALPRAZolam (XANAX) 0.5 MG tablet, Take 1 tablet (0.5 mg total) by mouth 2 (two) times daily as needed for anxiety., Disp: 30 tablet, Rfl: 5   aspirin 81 MG chewable tablet, Chew by mouth daily., Disp: , Rfl:    B Complex-C (B-COMPLEX WITH VITAMIN C) tablet, Take 1 tablet by mouth daily., Disp: , Rfl:  calcium carbonate (OSCAL) 1500 (600 Ca) MG TABS tablet, Take by mouth 2 (two) times daily with a meal., Disp: , Rfl:    cetirizine (ZYRTEC) 10 MG tablet, Take 10 mg by mouth daily., Disp: , Rfl:    Cholecalciferol (VITAMIN D3) 75 MCG (3000 UT) TABS, Take by mouth., Disp: , Rfl:    lisinopril (ZESTRIL) 20 MG tablet, TAKE 1 TABLET BY MOUTH EVERY DAY, Disp: 90 tablet, Rfl: 3   meloxicam (MOBIC) 15 MG tablet, TAKE 1 TABLET (15 MG TOTAL) BY MOUTH DAILY., Disp: 30 tablet, Rfl: 0   Multiple Vitamins-Minerals (MULTIVITAMIN WITH MINERALS) tablet, Take 1 tablet by mouth daily., Disp: , Rfl:    rosuvastatin (CRESTOR) 5 MG tablet, TAKE 1 TABLET BY MOUTH EVERY OTHER DAY, Disp: 45 tablet, Rfl: 1   zinc gluconate 50 MG tablet, Take 50 mg by mouth daily., Disp: , Rfl:    Review of Systems:   Negative unless otherwise specified per HPI.  Vitals:   Vitals:   03/10/24 1302  BP: (!) 150/80  Pulse: 74  Temp: 98.2 F (36.8 C)  TempSrc: Temporal  SpO2: 98%  Weight: 146 lb 4 oz  (66.3 kg)  Height: 5\' 5"  (1.651 m)     Body mass index is 24.34 kg/m.  Physical Exam:   Physical Exam Vitals and nursing note reviewed.  Constitutional:      General: She is not in acute distress.    Appearance: She is well-developed. She is not ill-appearing or toxic-appearing.  HENT:     Head: Normocephalic and atraumatic.     Right Ear: Tympanic membrane, ear canal and external ear normal. Tympanic membrane is not erythematous, retracted or bulging.     Left Ear: Tympanic membrane, ear canal and external ear normal. Tympanic membrane is not erythematous, retracted or bulging.     Nose: Nose normal.     Right Sinus: No maxillary sinus tenderness or frontal sinus tenderness.     Left Sinus: No maxillary sinus tenderness or frontal sinus tenderness.     Mouth/Throat:     Pharynx: Uvula midline. Posterior oropharyngeal erythema present.  Eyes:     General: Lids are normal.     Conjunctiva/sclera: Conjunctivae normal.  Neck:     Trachea: Trachea normal.  Cardiovascular:     Rate and Rhythm: Normal rate and regular rhythm.     Heart sounds: Normal heart sounds, S1 normal and S2 normal.  Pulmonary:     Effort: Pulmonary effort is normal.     Breath sounds: Normal breath sounds. No decreased breath sounds, wheezing, rhonchi or rales.  Lymphadenopathy:     Cervical: No cervical adenopathy.  Skin:    General: Skin is warm and dry.  Neurological:     Mental Status: She is alert.  Psychiatric:        Speech: Speech normal.        Behavior: Behavior normal. Behavior is cooperative.    Results for orders placed or performed in visit on 03/10/24  POCT rapid strep A  Result Value Ref Range   Rapid Strep A Screen Negative Negative  POC COVID-19  Result Value Ref Range   SARS Coronavirus 2 Ag Negative Negative  POCT Influenza A/B  Result Value Ref Range   Influenza A, POC Negative Negative   Influenza B, POC Negative Negative     Assessment and Plan:   1. Sore throat  (Primary) - POCT rapid strep A - POC COVID-19 - POCT Influenza A/B   All  Point of Care (POC) testing is negative Suspect viral upper respiratory infection (URI) No red flags on exam.   Discussed taking medications as prescribed.  Reviewed return precautions including new or worsening fever, SOB, new or worsening cough or other concerns.  Continue tylenol or ibuprofen as needed  Push fluids and rest.  I recommend that patient follow-up if symptoms worsen or persist despite treatment x 7-10 days, sooner if needed.   I, Bernita Bristle, acting as a Neurosurgeon for Alexander Iba, [place]., have documented all relevant documentation on the behalf of Alexander Iba, [place], as directed by  Alexander Iba, PA while in the presence of Alexander Iba, [place].  I, Alexander Iba, [place], have reviewed all documentation for this visit. The documentation on 03/10/24 for the exam, diagnosis, procedures, and orders are all accurate and complete.  Alexander Iba, PA-C

## 2024-03-10 NOTE — Patient Instructions (Addendum)
 It was great to see you!  All testing is negative You may have hand, foot and mouth or some other virus Keep us  posted on symptom(s) and if you need anything  Take care,  Kairi Tufo PA-C

## 2024-03-10 NOTE — Telephone Encounter (Signed)
 Has an OV with Jola Nash today

## 2024-03-10 NOTE — Telephone Encounter (Signed)
 Chief Complaint: Sore throat Symptoms: fever 99-101, body aches, post nasal drip, mild sneezing, fatigue, neck pain, headache, sore throat pain 5/10, redness to throat Frequency: Onset Sunday Pertinent Negatives: Patient denies other symptoms Disposition: []ED /[]Urgent Care (no appt availability in office) / [x]Appointment(In office/virtual)/ [] [facility]/ []Home Care/ []Refused Recommended Disposition /[][facility]/ [] Follow-up with PCP Additional Notes: Patient says her grandson was diagnosed with hand, foot, mouth and she\'s been around him. Sunday she noticed a mild sore throat with aches and Monday is when she took temperature and it was 101. She says she\'s feeling tired and she\'s wondering if she has strep throat, flu, or something. Advised no availability with PCP today, able to schedule with Samantha, she agreed.   Copied from CRM #[id number]. Topic: Clinical - Red Word Triage >> Mar 10, 2024 10:22 AM Shalena F wrote: Red Word that prompted transfer to Nurse Triage: Patient has 101 fever, sore throat, neck pain, and body aches Reason for Disposition  [1] Sore throat is the only symptom AND [2] present > 48 hours  Answer Assessment - Initial Assessment Questions 1. ONSET: "When did the throat start hurting?" (Hours or days ago)      Sunday 2. SEVERITY: "How bad is the sore throat?" (Scale 1-10; mild, moderate or severe)   - MILD (1-3):  Doesn\'t interfere with eating or normal activities.   - MODERATE (4-7): Interferes with eating some solids and normal activities.   - SEVERE (8-10):  Excruciating pain, interferes with most normal activities.   - SEVERE WITH DYSPHAGIA (10): Can\'t swallow liquids, drooling.     5 3. STREP EXPOSURE: "Has there been any exposure to strep within the past week?" If Yes, ask: "What type of contact occurred?"      Not sure, but grandson has hand/foot/mouth disease 4.  VIRAL SYMPTOMS: "Are there any symptoms of a cold, such as a runny  nose, cough, hoarse voice or red eyes?"      Cough, some post nasal drip, sneezes here and there 5. FEVER: "Do you have a fever?" If Yes, ask: "What is your temperature, how was it measured, and when did it start?"     99 -101 with medication temp 99 6. PUS ON THE TONSILS: "Is there pus on the tonsils in the back of your throat?"     No pus, tonsils red 7. OTHER SYMPTOMS: "Do you have any other symptoms?" (e.g., difficulty breathing, headache, rash)     Fever 99-101 even with meds, neck pain, body aches, headache, tired  Protocols used: Sore Throat-A-AH

## 2024-04-08 DIAGNOSIS — K080 Exfoliation of teeth due to systemic causes: Secondary | ICD-10-CM | POA: Diagnosis not present

## 2024-05-13 ENCOUNTER — Ambulatory Visit: Admitting: Internal Medicine

## 2024-05-13 ENCOUNTER — Encounter: Payer: Self-pay | Admitting: Family Medicine

## 2024-05-13 ENCOUNTER — Ambulatory Visit (INDEPENDENT_AMBULATORY_CARE_PROVIDER_SITE_OTHER): Admitting: Family Medicine

## 2024-05-13 VITALS — BP 150/80 | HR 76 | Temp 97.4°F | Resp 18 | Ht 65.0 in | Wt 143.4 lb

## 2024-05-13 DIAGNOSIS — B9689 Other specified bacterial agents as the cause of diseases classified elsewhere: Secondary | ICD-10-CM | POA: Diagnosis not present

## 2024-05-13 DIAGNOSIS — J208 Acute bronchitis due to other specified organisms: Secondary | ICD-10-CM | POA: Diagnosis not present

## 2024-05-13 MED ORDER — AZITHROMYCIN 250 MG PO TABS: ORAL_TABLET | ORAL | 0 refills | Status: AC

## 2024-05-13 MED ORDER — BENZONATATE 100 MG PO CAPS: 100.0000 mg | ORAL_CAPSULE | Freq: Three times a day (TID) | ORAL | 0 refills | Status: AC | PRN

## 2024-05-13 MED ORDER — PREDNISONE 20 MG PO TABS: 40.0000 mg | ORAL_TABLET | Freq: Every day | ORAL | 0 refills | Status: AC

## 2024-05-13 NOTE — Progress Notes (Signed)
 Subjective:     Patient ID: Morgan Carter, female    DOB: [birth year], 78 y.o.   MRN: [id number]  Chief Complaint  Patient presents with   Cough    deep cough that started last Thursday, couging all night, coughing up a solid color not clear when able to get it up    HPI Discussed the use of AI scribe software for clinical note transcription with the patient, who gave verbal consent to proceed.  History of Present Illness Morgan Carter is a 78 year old female who presents with persistent cough and sneezing.  She has been experiencing persistent sneezing and coughing since last Thursday. The sneezing is described as 'explosive' and has startled her dogs. The coughing began on Friday, accompanied by a dry sore throat, and by the next day, she was hoarse, though the hoarseness resolved. On Sunday, the sneezing returned, and she became concerned about aspirating her food. The cough persists and is now waking her up at night, leading to fatigue.  She recalls a similar episode last fall that progressed to bronchitis after being ignored. Many years ago, she experienced similar symptoms and was treated with Zyrtec and a short course of steroids, which were effective. She is worried about being sick during her planned visit to the beach with her daughter and grandson next week.  She uses an albuterol inhaler as needed and reports occasional shortness of breath, particularly after physical exertion, such as walking two miles, which results in heavy breathing and subsequent coughing. No fever is reported, but she notes that her temperature is usually low, so a normal reading might indicate a fever for her.  She has a long-standing history of high blood pressure, managed with lisinopril. She notes that her blood pressure tends to be higher when she is sick or stressed, and she is scheduled for a routine check-up on July 1st. No ear pain or infections are reported.     There are no  preventive care reminders to display for this patient.  Past Medical History:  Diagnosis Date   Hyperlipidemia    Hypertension     Past Surgical History:  Procedure Laterality Date   BREAST BIOPSY Left 1998   benign   BREAST BIOPSY Right 1992   benign   BREAST BIOPSY Right 1991   benign   BREAST BIOPSY Right 1990   benign   CESAREAN SECTION  11/10/1984   KNEE SURGERY  1992     Current Outpatient Medications:    albuterol (VENTOLIN HFA) 108 (90 Base) MCG/ACT inhaler, SMARTSIG:1 Puff(s) Via Inhaler Every 4 Hours PRN, Disp: , Rfl:    ALPRAZolam  (XANAX ) 0.5 MG tablet, Take 1 tablet (0.5 mg total) by mouth 2 (two) times daily as needed for anxiety., Disp: 30 tablet, Rfl: 5   aspirin 81 MG chewable tablet, Chew by mouth daily., Disp: , Rfl:    azithromycin  (ZITHROMAX ) 250 MG tablet, Take 2 tablets on day 1, then 1 tablet daily on days 2 through 5, Disp: 6 tablet, Rfl: 0   B Complex-C (B-COMPLEX WITH VITAMIN C) tablet, Take 1 tablet by mouth daily., Disp: , Rfl:    benzonatate (TESSALON PERLES) 100 MG capsule, Take 1 capsule (100 mg total) by mouth 3 (three) times daily as needed., Disp: 20 capsule, Rfl: 0   calcium carbonate (OSCAL) 1500 (600 Ca) MG TABS tablet, Take by mouth 2 (two) times daily with a meal., Disp: , Rfl:    cetirizine (ZYRTEC) 10 MG tablet,  Take 10 mg by mouth daily., Disp: , Rfl:    Cholecalciferol (VITAMIN D3) 75 MCG (3000 UT) TABS, Take by mouth., Disp: , Rfl:    lisinopril (ZESTRIL) 20 MG tablet, TAKE 1 TABLET BY MOUTH EVERY DAY, Disp: 90 tablet, Rfl: 3   meloxicam  (MOBIC ) 15 MG tablet, TAKE 1 TABLET (15 MG TOTAL) BY MOUTH DAILY., Disp: 30 tablet, Rfl: 0   Multiple Vitamins-Minerals (MULTIVITAMIN WITH MINERALS) tablet, Take 1 tablet by mouth daily., Disp: , Rfl:    predniSONE  (DELTASONE ) 20 MG tablet, Take 2 tablets (40 mg total) by mouth daily with breakfast for 5 days., Disp: 10 tablet, Rfl: 0   rosuvastatin (CRESTOR) 5 MG tablet, TAKE 1 TABLET BY MOUTH EVERY  OTHER DAY, Disp: 45 tablet, Rfl: 1   zinc gluconate 50 MG tablet, Take 50 mg by mouth daily., Disp: , Rfl:   Allergies  Allergen Reactions   Shrimp (Diagnostic) Anaphylaxis   Cats Claw (Uncaria Tomentosa) Other (See Comments)    Itchy runny eye and cough   ROS neg/noncontributory except as noted HPI/below      Objective:     BP (!) 150/80 (BP Location: Left Arm, Patient Position: Sitting, Cuff Size: Normal)   Pulse 76   Temp (!) 97.4 F (36.3 C) (Temporal)   Resp 18   Ht 5' 5 (1.651 m)   Wt 143 lb 6 oz (65 kg)   SpO2 95%   BMI 23.86 kg/m  Wt Readings from Last 3 Encounters:  05/13/24 143 lb 6 oz (65 kg)  03/10/24 146 lb 4 oz (66.3 kg)  02/21/24 143 lb 3.2 oz (65 kg)    Physical Exam   Gen: WDWN NAD HEENT: NCAT, conjunctiva not injected, sclera nonicteric TM WNL B, OP moist, no exudates  NECK:  supple, no thyromegaly, no nodes, CARDIAC: RRR, S1S2+, no murmur.  LUNGS: CTAB. No wheezes EXT:  no edema MSK: no gross abnormalities.  NEURO: A&O x3.  CN II-XII intact.  PSYCH: normal mood. Good eye contact     Assessment & Plan:  Acute bacterial bronchitis  Other orders -     Azithromycin ; Take 2 tablets on day 1, then 1 tablet daily on days 2 through 5  Dispense: 6 tablet; Refill: 0 -     predniSONE ; Take 2 tablets (40 mg total) by mouth daily with breakfast for 5 days.  Dispense: 10 tablet; Refill: 0 -     Benzonatate; Take 1 capsule (100 mg total) by mouth 3 (three) times daily as needed.  Dispense: 20 capsule; Refill: 0  Assessment and Plan Assessment & Plan Acute Bronchitis   She presents with symptoms consistent with acute bronchitis, including persistent cough, sneezing, and hoarseness since last Thursday, leading to significant fatigue and sleep disturbance. Similar past episodes have progressed to bronchitis. Her lungs are clear on examination, with no fever and no recent COVID exposure, though she had COVID three times since vaccination, last in March.  Concerned about an upcoming family beach trip, she agreed to start antibiotics and steroids to address potential bacterial infection and reduce inflammation, with informed consent regarding side effects and the need for further medical attention if symptoms worsen. Prescribe azithromycin  (Z-Pak) and prednisone . Prescribe Tessalon Perles for cough management. Advise using over-the-counter cough suppressants like Delsym, Robitussin DM, or Mucinex  DM. Instruct to use an albuterol inhaler as needed for shortness of breath. Advise monitoring symptoms and seeking emergency care if the condition worsens.  Hypertension   Her long-standing hypertension is managed with  lisinopril. Blood pressure is elevated during the visit, likely due to stress from illness, though sometimes within normal range at home. Continue current medication and monitor blood pressure closely during illness. Follow up on July 1st for a routine blood pressure check.  General Health Maintenance   She is generally in good health, active, walking two miles regularly, and mindful of health conditions. Encourage continued physical activity as tolerated. Advise avoiding known allergens, such as shrimp and cat's claw.    Return if symptoms worsen or fail to improve.  Ellsworth Haas, MD

## 2024-05-13 NOTE — Patient Instructions (Signed)
 Sent meds to pharmacy  Delsym Robutussin dm Mucinex  dm

## 2024-05-14 ENCOUNTER — Ambulatory Visit: Admitting: Family Medicine

## 2024-05-26 ENCOUNTER — Encounter: Payer: Self-pay | Admitting: Family Medicine

## 2024-05-26 ENCOUNTER — Ambulatory Visit (INDEPENDENT_AMBULATORY_CARE_PROVIDER_SITE_OTHER): Admitting: Family Medicine

## 2024-05-26 VITALS — BP 136/72 | HR 69 | Temp 97.5°F | Ht 65.0 in | Wt 140.6 lb

## 2024-05-26 DIAGNOSIS — F419 Anxiety disorder, unspecified: Secondary | ICD-10-CM

## 2024-05-26 DIAGNOSIS — Z0001 Encounter for general adult medical examination with abnormal findings: Secondary | ICD-10-CM | POA: Diagnosis not present

## 2024-05-26 DIAGNOSIS — L409 Psoriasis, unspecified: Secondary | ICD-10-CM | POA: Diagnosis not present

## 2024-05-26 DIAGNOSIS — I10 Essential (primary) hypertension: Secondary | ICD-10-CM

## 2024-05-26 DIAGNOSIS — E785 Hyperlipidemia, unspecified: Secondary | ICD-10-CM | POA: Diagnosis not present

## 2024-05-26 DIAGNOSIS — Z131 Encounter for screening for diabetes mellitus: Secondary | ICD-10-CM | POA: Diagnosis not present

## 2024-05-26 DIAGNOSIS — J45901 Unspecified asthma with (acute) exacerbation: Secondary | ICD-10-CM

## 2024-05-26 LAB — LIPID PANEL
Cholesterol: 164 mg/dL (ref 0–200)
HDL: 56.9 mg/dL (ref >39.00)
LDL Cholesterol: 76 mg/dL (ref 0–99)
NonHDL: 106.89
Total CHOL/HDL Ratio: 3
Triglycerides: 152 mg/dL — ABNORMAL HIGH (ref 0.0–149.0)
VLDL: 30.4 mg/dL (ref 0.0–40.0)

## 2024-05-26 LAB — CBC
HCT: 42.2 % (ref 36.0–46.0)
Hemoglobin: 13.8 g/dL (ref 12.0–15.0)
MCHC: 32.7 g/dL (ref 30.0–36.0)
MCV: 99.1 fl (ref 78.0–100.0)
Platelets: 294 10*3/uL (ref 150.0–400.0)
RBC: 4.26 Mil/uL (ref 3.87–5.11)
RDW: 13.6 % (ref 11.5–15.5)
WBC: 9.2 10*3/uL (ref 4.0–10.5)

## 2024-05-26 LAB — COMPREHENSIVE METABOLIC PANEL WITH GFR
ALT: 16 U/L (ref 0–35)
AST: 16 U/L (ref 0–37)
Albumin: 4.2 g/dL (ref 3.5–5.2)
Alkaline Phosphatase: 82 U/L (ref 39–117)
BUN: 8 mg/dL (ref 6–23)
CO2: 29 meq/L (ref 19–32)
Calcium: 9.4 mg/dL (ref 8.4–10.5)
Chloride: 103 meq/L (ref 96–112)
Creatinine, Ser: 0.79 mg/dL (ref 0.40–1.20)
GFR: 73.12 mL/min (ref >60.00)
Glucose, Bld: 54 mg/dL — ABNORMAL LOW (ref 70–99)
Potassium: 4.9 meq/L (ref 3.5–5.1)
Sodium: 140 meq/L (ref 135–145)
Total Bilirubin: 0.5 mg/dL (ref 0.2–1.2)
Total Protein: 6.4 g/dL (ref 6.0–8.3)

## 2024-05-26 LAB — TSH: TSH: 3.32 u[IU]/mL (ref 0.35–5.50)

## 2024-05-26 LAB — HEMOGLOBIN A1C: Hgb A1c MFr Bld: 5.6 % (ref 4.6–6.5)

## 2024-05-26 NOTE — Assessment & Plan Note (Signed)
 Check lipids.  Discussed lifestyle modifications.  She is doing well with Crestor 5 mg daily.

## 2024-05-26 NOTE — Assessment & Plan Note (Signed)
 Stable on alprazolam  0.5 mg twice daily as needed.  Does not need refill today.

## 2024-05-26 NOTE — Assessment & Plan Note (Signed)
 Had recent flareup but symptoms are now stable.  Has albuterol to use as needed.  Reassuring exam today.

## 2024-05-26 NOTE — Patient Instructions (Signed)
 It was very nice to see you today!  I am glad that you are doing well today.  You are up-to-date on your vaccines and screenings.  Will check labs today.  Please continue to work on diet and exercise.  I will see you back in a year for your next physical.  Come back sooner if needed.  Return in about 1 year (around 05/26/2025) for Annual Physical.   Take care, Dr Kennyth  PLEASE NOTE:  If you had any lab tests, please let us  know if you have not heard back within a few days. You may see your results on mychart before we have a chance to review them but we will give you a call once they are reviewed by us .   If we ordered any referrals today, please let us  know if you have not heard from their office within the next week.   If you had any urgent prescriptions sent in today, please check with the pharmacy within an hour of our visit to make sure the prescription was transmitted appropriately.   Please try these tips to maintain a healthy lifestyle:  Eat at least 3 REAL meals and 1-2 snacks per day.  Aim for no more than 5 hours between eating.  If you eat breakfast, please do so within one hour of getting up.   Each meal should contain half fruits/vegetables, one quarter protein, and one quarter carbs (no bigger than a computer mouse)  Cut down on sweet beverages. This includes juice, soda, and sweet tea.   Drink at least 1 glass of water with each meal and aim for at least 8 glasses per day  Exercise at least 150 minutes every week.    Preventive Care 6 Years and Older, Female Preventive care refers to lifestyle choices and visits with your health care provider that can promote health and wellness. Preventive care visits are also called wellness exams. What can I expect for my preventive care visit? Counseling Your health care provider may ask you questions about your: Medical history, including: Past medical problems. Family medical history. Pregnancy and menstrual  history. History of falls. Current health, including: Memory and ability to understand (cognition). Emotional well-being. Home life and relationship well-being. Sexual activity and sexual health. Lifestyle, including: Alcohol, nicotine or tobacco, and drug use. Access to firearms. Diet, exercise, and sleep habits. Work and work Astronomer. Sunscreen use. Safety issues such as seatbelt and bike helmet use. Physical exam Your health care provider will check your: Height and weight. These may be used to calculate your BMI (body mass index). BMI is a measurement that tells if you are at a healthy weight. Waist circumference. This measures the distance around your waistline. This measurement also tells if you are at a healthy weight and may help predict your risk of certain diseases, such as type 2 diabetes and high blood pressure. Heart rate and blood pressure. Body temperature. Skin for abnormal spots. What immunizations do I need?  Vaccines are usually given at various ages, according to a schedule. Your health care provider will recommend vaccines for you based on your age, medical history, and lifestyle or other factors, such as travel or where you work. What tests do I need? Screening Your health care provider may recommend screening tests for certain conditions. This may include: Lipid and cholesterol levels. Hepatitis C test. Hepatitis B test. HIV (human immunodeficiency virus) test. STI (sexually transmitted infection) testing, if you are at risk. Lung cancer screening. Colorectal cancer screening.  Diabetes screening. This is done by checking your blood sugar (glucose) after you have not eaten for a while (fasting). Mammogram. Talk with your health care provider about how often you should have regular mammograms. BRCA-related cancer screening. This may be done if you have a family history of breast, ovarian, tubal, or peritoneal cancers. Bone density scan. This is done to  screen for osteoporosis. Talk with your health care provider about your test results, treatment options, and if necessary, the need for more tests. Follow these instructions at home: Eating and drinking  Eat a diet that includes fresh fruits and vegetables, whole grains, lean protein, and low-fat dairy products. Limit your intake of foods with high amounts of sugar, saturated fats, and salt. Take vitamin and mineral supplements as recommended by your health care provider. Do not drink alcohol if your health care provider tells you not to drink. If you drink alcohol: Limit how much you have to 0-1 drink a day. Know how much alcohol is in your drink. In the [place] one drink equals one 12 oz bottle of beer (355 mL), one 5 oz glass of wine (148 mL), or one 1 oz glass of hard liquor (44 mL). Lifestyle Brush your teeth every morning and night with fluoride toothpaste. Floss one time each day. Exercise for at least 30 minutes 5 or more days each week. Do not use any products that contain nicotine or tobacco. These products include cigarettes, chewing tobacco, and vaping devices, such as e-cigarettes. If you need help quitting, ask your health care provider. Do not use drugs. If you are sexually active, practice safe sex. Use a condom or other form of protection in order to prevent STIs. Take aspirin only as told by your health care provider. Make sure that you understand how much to take and what form to take. Work with your health care provider to find out whether it is safe and beneficial for you to take aspirin daily. Ask your health care provider if you need to take a cholesterol-lowering medicine (statin). Find healthy ways to manage stress, such as: Meditation, yoga, or listening to music. Journaling. Talking to a trusted person. Spending time with friends and family. Minimize exposure to UV radiation to reduce your risk of skin cancer. Safety Always wear your seat belt while driving or  riding in a vehicle. Do not drive: If you have been drinking alcohol. Do not ride with someone who has been drinking. When you are tired or distracted. While texting. If you have been using any mind-altering substances or drugs. Wear a helmet and other protective equipment during sports activities. If you have firearms in your house, make sure you follow all gun safety procedures. What's next? Visit your health care provider once a year for an annual wellness visit. Ask your health care provider how often you should have your eyes and teeth checked. Stay up to date on all vaccines. This information is not intended to replace advice given to you by your health care provider. Make sure you discuss any questions you have with your health care provider. Document Revised: 05/10/2021 Document Reviewed: 05/10/2021 Elsevier Patient Education  2024 [employer].

## 2024-05-26 NOTE — Assessment & Plan Note (Signed)
At goal on lisinopril 20mg  daily.  Check labs.

## 2024-05-26 NOTE — Progress Notes (Signed)
 Chief Complaint:  Morgan Carter is a 78 y.o. female who presents today for her annual comprehensive physical exam.    Assessment/Plan:  New/Acute Problems: Bronchitis Reassuring exam today.  Symptoms are improving.  She will let us  know if she has any further issues with this.  Chronic Problems Addressed Today: Essential hypertension At goal on lisinopril 20 mg daily.  Check labs.  Dyslipidemia Check lipids.  Discussed lifestyle modifications.  She is doing well with Crestor 5 mg daily.  Anxiety Stable on alprazolam  0.5 mg twice daily as needed.  Does not need refill today.  Reactive airway disease Had recent flareup but symptoms are now stable.  Has albuterol to use as needed.  Reassuring exam today.  Preventative Healthcare: Up-to-date on vaccines-will update records today.  Up-to-date on colonoscopy.  Check labs.  Patient Counseling(The following topics were reviewed and/or handout was given):  -Nutrition: Stressed importance of moderation in sodium/caffeine intake, saturated fat and cholesterol, caloric balance, sufficient intake of fresh fruits, vegetables, and fiber.  -Stressed the importance of regular exercise.   -Substance Abuse: Discussed cessation/primary prevention of tobacco, alcohol, or other drug use; driving or other dangerous activities under the influence; availability of treatment for abuse.   -Injury prevention: Discussed safety belts, safety helmets, smoke detector, smoking near bedding or upholstery.   -Sexuality: Discussed sexually transmitted diseases, partner selection, use of condoms, avoidance of unintended pregnancy and contraceptive alternatives.   -Dental health: Discussed importance of regular tooth brushing, flossing, and dental visits.  -Health maintenance and immunizations reviewed. Please refer to Health maintenance section.  Return to care in 1 year for next preventative visit.     Subjective:  HPI:  She has no acute complaints  today. She is doing well today. She is here for her annual physical.  She was seen here a couple of weeks ago with cough and congestion. She was diagnosed with bronchitis and treated with azithromycin , tessalon , and prednisone . Her symptoms are improving.   Lifestyle Diet: Balanced. Plenty of fruits and vegetables.  Exercise: Trying to stay active around the house.      12/05/2023   10:45 AM  Depression screen PHQ 2/9  Decreased Interest 0  Down, Depressed, Hopeless 0  PHQ - 2 Score 0    There are no preventive care reminders to display for this patient.   ROS: Per HPI, otherwise a complete review of systems was negative.   PMH:  The following were reviewed and entered/updated in epic: Past Medical History:  Diagnosis Date   Hyperlipidemia    Hypertension    Patient Active Problem List   Diagnosis Date Noted   Psoriasis 09/03/2022   Essential hypertension 09/03/2022   Dyslipidemia 09/03/2022   Anxiety 09/03/2022   Reactive airway disease 09/03/2022   Past Surgical History:  Procedure Laterality Date   BREAST BIOPSY Left 1998   benign   BREAST BIOPSY Right 1992   benign   BREAST BIOPSY Right 1991   benign   BREAST BIOPSY Right 1990   benign   CESAREAN SECTION  11/10/1984   KNEE SURGERY  1992    Family History  Problem Relation Age of Onset   COPD Mother    Heart disease Mother    Asthma Mother    Hypertension Mother    Early death Father    Drug abuse Sister    Depression Sister    COPD Sister    Asthma Sister    Arthritis Sister    Alcohol abuse Sister  Hearing loss Sister    Asthma Sister    Early death Maternal Grandmother    Diabetes Maternal Grandfather    Alcohol abuse Paternal Grandmother    Diabetes Paternal Grandmother    Hearing loss Paternal Grandfather    Drug abuse Daughter    Depression Daughter    Alcohol abuse Daughter    Hypertension Son    Hyperlipidemia Son    Drug abuse Son    Depression Son    Alcohol abuse Son    Colon  cancer Neg Hx    Colon polyps Neg Hx    Esophageal cancer Neg Hx    Rectal cancer Neg Hx    Stomach cancer Neg Hx     Medications- reviewed and updated Current Outpatient Medications  Medication Sig Dispense Refill   albuterol (VENTOLIN HFA) 108 (90 Base) MCG/ACT inhaler SMARTSIG:1 Puff(s) Via Inhaler Every 4 Hours PRN     ALPRAZolam  (XANAX ) 0.5 MG tablet Take 1 tablet (0.5 mg total) by mouth 2 (two) times daily as needed for anxiety. 30 tablet 5   aspirin 81 MG chewable tablet Chew by mouth daily.     B Complex-C (B-COMPLEX WITH VITAMIN C) tablet Take 1 tablet by mouth daily.     benzonatate  (TESSALON  PERLES) 100 MG capsule Take 1 capsule (100 mg total) by mouth 3 (three) times daily as needed. 20 capsule 0   calcium carbonate (OSCAL) 1500 (600 Ca) MG TABS tablet Take by mouth 2 (two) times daily with a meal.     cetirizine (ZYRTEC) 10 MG tablet Take 10 mg by mouth daily.     Cholecalciferol (VITAMIN D3) 75 MCG (3000 UT) TABS Take by mouth.     lisinopril (ZESTRIL) 20 MG tablet TAKE 1 TABLET BY MOUTH EVERY DAY 90 tablet 3   meloxicam  (MOBIC ) 15 MG tablet TAKE 1 TABLET (15 MG TOTAL) BY MOUTH DAILY. 30 tablet 0   Multiple Vitamins-Minerals (MULTIVITAMIN WITH MINERALS) tablet Take 1 tablet by mouth daily.     rosuvastatin (CRESTOR) 5 MG tablet TAKE 1 TABLET BY MOUTH EVERY OTHER DAY 45 tablet 1   zinc gluconate 50 MG tablet Take 50 mg by mouth daily.     No current facility-administered medications for this visit.    Allergies-reviewed and updated Allergies  Allergen Reactions   Shrimp (Diagnostic) Anaphylaxis   Cats Claw (Uncaria Tomentosa) Other (See Comments)    Itchy runny eye and cough    Social History   Socioeconomic History   Marital status: Single    Spouse name: Not on file   Number of children: Not on file   Years of education: Not on file   Highest education level: Not on file  Occupational History   Not on file  Tobacco Use   Smoking status: Former    Types:  Cigarettes   Smokeless tobacco: Never  Vaping Use   Vaping status: Never Used  Substance and Sexual Activity   Alcohol use: Not Currently   Drug use: Never   Sexual activity: Not on file  Other Topics Concern   Not on file  Social History Narrative   Not on file   Social Drivers of Health   Financial Resource Strain: Low Risk  (12/05/2023)   Overall Financial Resource Strain (CARDIA)    Difficulty of Paying Living Expenses: Not hard at all  Food Insecurity: No Food Insecurity (12/05/2023)   Hunger Vital Sign    Worried About Running Out of Food in the Last Year: Never  true    Ran Out of Food in the Last Year: Never true  Transportation Needs: No Transportation Needs (12/05/2023)   PRAPARE - Administrator, Civil Service (Medical): No    Lack of Transportation (Non-Medical): No  Physical Activity: Sufficiently Active (12/05/2023)   Exercise Vital Sign    Days of Exercise per Week: 5 days    Minutes of Exercise per Session: 30 min  Stress: No Stress Concern Present (12/05/2023)   [employer] of Occupational Health - Occupational Stress Questionnaire    Feeling of Stress : Not at all  Social Connections: Moderately Isolated (12/05/2023)   Social Connection and Isolation Panel    Frequency of Communication with Friends and Family: More than three times a week    Frequency of Social Gatherings with Friends and Family: More than three times a week    Attends Religious Services: More than 4 times per year    Active Member of [employer] or Organizations: No    Attends Engineer, structural: Never    Marital Status: Never married        Objective:  Physical Exam: BP 136/72   Pulse 69   Temp (!) 97.5 F (36.4 C)   Ht 5' 5 (1.651 m)   Wt 140 lb 9.6 oz (63.8 kg)   SpO2 96%   BMI 23.40 kg/m   Body mass index is 23.4 kg/m. Wt Readings from Last 3 Encounters:  05/26/24 140 lb 9.6 oz (63.8 kg)  05/13/24 143 lb 6 oz (65 kg)  03/10/24 146 lb 4 oz (66.3 kg)   Gen:  NAD, resting comfortably HEENT: TMs normal bilaterally. OP clear. No thyromegaly noted.  CV: RRR with no murmurs appreciated Pulm: NWOB, CTAB with no crackles, wheezes, or rhonchi GI: Normal bowel sounds present. Soft, Nontender, Nondistended. MSK: no edema, cyanosis, or clubbing noted Skin: warm, dry Neuro: CN2-12 grossly intact. Strength 5/5 in upper and lower extremities. Reflexes symmetric and intact bilaterally.  Psych: Normal affect and thought content     Morgan Schweppe M. Kennyth, MD 05/26/2024 9:40 AM

## 2024-05-27 ENCOUNTER — Ambulatory Visit: Payer: Self-pay | Admitting: Family Medicine

## 2024-05-27 NOTE — Progress Notes (Signed)
 Her glucose was a little low.  This is probably due to her fasting.  She should let us  know if she is having any symptoms of hypoglycemia such as nervousness, sweating, flushing, etc. If she is not having any issues with this then we do not need to do any other testing at this point  Triglycerides are mildly elevated.  This is not clinically significant.  All of her other labs are at goal.  Do not need to make any other changes to her treatment at this time.  She should continue to work on diet and exercise and we can recheck in a year.

## 2024-06-05 ENCOUNTER — Emergency Department (HOSPITAL_BASED_OUTPATIENT_CLINIC_OR_DEPARTMENT_OTHER)
Admission: EM | Admit: 2024-06-05 | Discharge: 2024-06-05 | Disposition: A | Discharge status: Home / Self Care | Attending: Emergency Medicine | Admitting: Emergency Medicine

## 2024-06-05 ENCOUNTER — Encounter (HOSPITAL_BASED_OUTPATIENT_CLINIC_OR_DEPARTMENT_OTHER): Payer: Self-pay

## 2024-06-05 ENCOUNTER — Other Ambulatory Visit: Payer: Self-pay

## 2024-06-05 ENCOUNTER — Emergency Department (HOSPITAL_BASED_OUTPATIENT_CLINIC_OR_DEPARTMENT_OTHER)

## 2024-06-05 DIAGNOSIS — S0990XA Unspecified injury of head, initial encounter: Secondary | ICD-10-CM | POA: Insufficient documentation

## 2024-06-05 DIAGNOSIS — Y9364 Activity, baseball: Secondary | ICD-10-CM | POA: Diagnosis not present

## 2024-06-05 DIAGNOSIS — W2103XA Struck by baseball, initial encounter: Secondary | ICD-10-CM | POA: Insufficient documentation

## 2024-06-05 DIAGNOSIS — I10 Essential (primary) hypertension: Secondary | ICD-10-CM | POA: Insufficient documentation

## 2024-06-05 DIAGNOSIS — Z79899 Other long term (current) drug therapy: Secondary | ICD-10-CM | POA: Diagnosis not present

## 2024-06-05 DIAGNOSIS — Z7982 Long term (current) use of aspirin: Secondary | ICD-10-CM | POA: Insufficient documentation

## 2024-06-05 DIAGNOSIS — Z043 Encounter for examination and observation following other accident: Secondary | ICD-10-CM | POA: Diagnosis not present

## 2024-06-05 NOTE — Discharge Instructions (Signed)
 The head CT fortunately did not show any signs of fracture or internal bleeding.  Apply ice to the area to help with the swelling.  Take over-the-counter medications as needed for pain

## 2024-06-05 NOTE — ED Triage Notes (Addendum)
 Pt POV d/t being hit with a baseball at the [employer] after hitting a metal chair.  It was a homerun and hit her right temple.   No thinners - slight bruising and slight swelling.   NO LOC Ice applied PTA - refused anymore

## 2024-06-05 NOTE — ED Provider Notes (Signed)
 [facility] AT [facility] Provider Note   CSN: [id number] Arrival date & time: 06/05/24  2153     Patient presents with: Head Injury   LAVAYAH VITA is a 78 y.o. female.    Head Injury     Patient has history of hypertension hyperlipidemia.  She presents to the ED for evaluation of a head injury.  Patient was at a baseball game when she was struck in the head by a homerun baseball.  Patient was hit on the right temple area.  Patient did not lose consciousness.  She is not on any anticoagulation.  Patient however has started developing headache on that right side.  She does have some discomfort with opening up her jaw but she does not have any mandibular pain.  No visual disturbance.  No neck pain Prior to Admission medications   Medication Sig Start Date End Date Taking? Authorizing Provider  albuterol (VENTOLIN HFA) 108 (90 Base) MCG/ACT inhaler SMARTSIG:1 Puff(s) Via Inhaler Every 4 Hours PRN 04/27/22   [provider]  ALPRAZolam  (XANAX ) 0.5 MG tablet Take 1 tablet (0.5 mg total) by mouth 2 (two) times daily as needed for anxiety. 02/21/24   Kennyth Worth HERO, MD  aspirin 81 MG chewable tablet Chew by mouth daily.    [provider]  B Complex-C (B-COMPLEX WITH VITAMIN C) tablet Take 1 tablet by mouth daily.    [provider]  benzonatate  (TESSALON  PERLES) 100 MG capsule Take 1 capsule (100 mg total) by mouth 3 (three) times daily as needed. 05/13/24   Wendolyn Jenkins Jansky, MD  calcium carbonate (OSCAL) 1500 (600 Ca) MG TABS tablet Take by mouth 2 (two) times daily with a meal.    [provider]  cetirizine (ZYRTEC) 10 MG tablet Take 10 mg by mouth daily.    [provider]  Cholecalciferol (VITAMIN D3) 75 MCG (3000 UT) TABS Take by mouth.    [provider]  lisinopril (ZESTRIL) 20 MG tablet TAKE 1 TABLET BY MOUTH EVERY DAY 01/15/24   Kennyth Worth HERO, MD  meloxicam  (MOBIC ) 15 MG tablet TAKE 1 TABLET (15  MG TOTAL) BY MOUTH DAILY. 01/14/24   Kennyth Worth HERO, MD  Multiple Vitamins-Minerals (MULTIVITAMIN WITH MINERALS) tablet Take 1 tablet by mouth daily.    [provider]  rosuvastatin (CRESTOR) 5 MG tablet TAKE 1 TABLET BY MOUTH EVERY OTHER DAY 01/20/24   Kennyth Worth HERO, MD  zinc gluconate 50 MG tablet Take 50 mg by mouth daily.    [provider]    Allergies: Shrimp (diagnostic) and Cats claw (uncaria tomentosa)    Review of Systems  Updated Vital Signs BP (!) 189/93   Pulse 76   Temp 98.6 F (37 C) (Oral)   Resp 18   Ht 1.651 m (5' 5)   Wt 63.8 kg   SpO2 97%   BMI 23.39 kg/m   Physical Exam Vitals and nursing note reviewed.  Constitutional:      General: She is not in acute distress.    Appearance: She is well-developed.  HENT:     Head: Normocephalic.     Comments: Small amount of bruising noted right temple area    Right Ear: External ear normal.     Left Ear: External ear normal.  Eyes:     General: No scleral icterus.       Right eye: No discharge.        Left eye: No discharge.  Conjunctiva/sclera: Conjunctivae normal.  Neck:     Trachea: No tracheal deviation.  Cardiovascular:     Rate and Rhythm: Normal rate.  Pulmonary:     Effort: Pulmonary effort is normal. No respiratory distress.     Breath sounds: No stridor.  Abdominal:     General: There is no distension.  Musculoskeletal:        General: No swelling or deformity.     Cervical back: Neck supple.  Skin:    General: Skin is warm and dry.     Findings: No rash.  Neurological:     Mental Status: She is alert. Mental status is at baseline.     Cranial Nerves: No dysarthria or facial asymmetry.     Motor: No seizure activity.     (all labs ordered are listed, but only abnormal results are displayed) Labs Reviewed - No data to display  EKG: None  Radiology: CT Head Wo Contrast Result Date: 06/05/2024 CLINICAL DATA:  Hit in right temple with baseball, initial encounter  EXAM: CT HEAD WITHOUT CONTRAST TECHNIQUE: Contiguous axial images were obtained from the base of the skull through the vertex without intravenous contrast. RADIATION DOSE REDUCTION: This exam was performed according to the departmental dose-optimization program which includes automated exposure control, adjustment of the mA and/or kV according to patient size and/or use of iterative reconstruction technique. COMPARISON:  02/11/2024 FINDINGS: Brain: No evidence of acute infarction, hemorrhage, hydrocephalus, extra-axial collection or mass lesion/mass effect. Vascular: No hyperdense vessel or unexpected calcification. Skull: Normal. Negative for fracture or focal lesion. Sinuses/Orbits: No acute finding. Other: None. IMPRESSION: No acute intracranial abnormality noted. Electronically Signed   By: Oneil Devonshire M.D.   On: 06/05/2024 22:48     Procedures   Medications Ordered in the ED - No data to display  Clinical Course as of 06/05/24 2313  Fri Jun 05, 2024  2308 Head CT without acute abnormality. [JK]    Clinical Course User Index [JK] Randol Simmonds, MD                                 Medical Decision Making Problems Addressed: Injury of head, initial encounter: acute illness or injury that poses a threat to life or bodily functions  Amount and/or Complexity of Data Reviewed Radiology: ordered and independent interpretation performed.   Patient presented to the ED for evaluation after head injury.  With the degree of force was concerned about the possibility of subarachnoid hemorrhage, skull fracture.  Fortunately his CT scan did not show any signs of serious injury.  Will have patient apply ice at home.  Take over-the-counter medications as needed.     Final diagnoses:  Injury of head, initial encounter    ED Discharge Orders     None          Randol Simmonds, MD 06/05/24 2314

## 2024-07-12 ENCOUNTER — Other Ambulatory Visit: Payer: Self-pay | Admitting: Family Medicine

## 2024-07-12 DIAGNOSIS — E7800 Pure hypercholesterolemia, unspecified: Secondary | ICD-10-CM

## 2024-07-29 ENCOUNTER — Other Ambulatory Visit: Payer: Self-pay | Admitting: Family Medicine

## 2024-07-29 DIAGNOSIS — Z1231 Encounter for screening mammogram for malignant neoplasm of breast: Secondary | ICD-10-CM

## 2024-08-17 ENCOUNTER — Telehealth: Payer: Self-pay | Admitting: *Deleted

## 2024-08-17 NOTE — Telephone Encounter (Signed)
 Copied from CRM [id number]. Topic: Clinical - Medication Prior Auth >> Aug 17, 2024  8:47 AM Jasmin G wrote: Reason for CRM: Pt requested covid vaccine prescription [facility] to [facility] at [address] [place], [place] [address], [phone]. She also requested a prescription of ondansetran of at least 15 tablets due to travelling soon. Call pt back if needed at [phone].  Ok to send Rx Zofran  to pt pharmacy? Savva Beamer,rMA

## 2024-08-18 NOTE — Telephone Encounter (Signed)
 Ok with me. Please place any necessary orders.

## 2024-08-19 ENCOUNTER — Other Ambulatory Visit: Payer: Self-pay | Admitting: *Deleted

## 2024-08-19 NOTE — Telephone Encounter (Signed)
 Please advise Rx ondansetran  Sig/

## 2024-08-20 ENCOUNTER — Other Ambulatory Visit: Payer: Self-pay | Admitting: *Deleted

## 2024-08-20 MED ORDER — ONDANSETRON HCL 4 MG PO TABS: 4.0000 mg | ORAL_TABLET | Freq: Three times a day (TID) | ORAL | 0 refills | Status: AC | PRN

## 2024-08-20 NOTE — Telephone Encounter (Signed)
 Rx send to pharmacy

## 2024-08-20 NOTE — Telephone Encounter (Signed)
 Ok with me. Please place any necessary orders.

## 2024-08-27 ENCOUNTER — Other Ambulatory Visit: Payer: Self-pay | Admitting: Family Medicine

## 2024-09-01 ENCOUNTER — Telehealth: Payer: Self-pay | Admitting: *Deleted

## 2024-09-01 NOTE — Telephone Encounter (Signed)
 Copied from CRM [id number]. Topic: General - Call Back - No Documentation >> Sep 01, 2024  1:45 PM Leah C wrote: Reason for CRM: Patient is traveling within a week, and patient is requesting if Dr.Parker can write a letter that confirms her medications that she's taking so that that they are not confiscated at the airport. Patient stated she can come pick up the letter or it can be uploaded.   [phone] (M)

## 2024-09-02 ENCOUNTER — Encounter: Payer: Self-pay | Admitting: Family Medicine

## 2024-09-02 NOTE — Telephone Encounter (Signed)
 Patient notified letter ready to be pick up  Verbalized understanding

## 2024-09-02 NOTE — Telephone Encounter (Signed)
 Ok to write letter.  Worth HERO. Kennyth, MD 09/02/2024 7:22 AM

## 2024-09-09 ENCOUNTER — Ambulatory Visit
Admission: RE | Admit: 2024-09-09 | Discharge: 2024-09-09 | Disposition: A | Discharge status: Home / Self Care | Source: Ambulatory Visit | Attending: Family Medicine | Admitting: Family Medicine

## 2024-09-09 DIAGNOSIS — Z1231 Encounter for screening mammogram for malignant neoplasm of breast: Secondary | ICD-10-CM | POA: Diagnosis not present

## 2024-10-28 DIAGNOSIS — K080 Exfoliation of teeth due to systemic causes: Secondary | ICD-10-CM | POA: Diagnosis not present

## 2024-12-09 ENCOUNTER — Ambulatory Visit (INDEPENDENT_AMBULATORY_CARE_PROVIDER_SITE_OTHER): Payer: Medicare Other

## 2024-12-09 VITALS — BP 132/76 | HR 81 | Temp 98.0°F | Ht 65.0 in | Wt 144.0 lb

## 2024-12-09 DIAGNOSIS — Z0000 Encounter for general adult medical examination without abnormal findings: Secondary | ICD-10-CM

## 2024-12-09 NOTE — Progress Notes (Signed)
 "  Chief Complaint  Patient presents with   Medicare Wellness     Subjective:   Morgan Carter is a 79 y.o. female who presents for a Medicare Annual Wellness Visit.  Visit info / Clinical Intake: Medicare Wellness Visit Type:: Subsequent Annual Wellness Visit Persons participating in visit and providing information:: patient Medicare Wellness Visit Mode:: In-person (required for WTM) Interpreter Needed?: No Pre-visit prep was completed: yes AWV questionnaire completed by patient prior to visit?: no Living arrangements:: with family/others Patient's Overall Health Status Rating: good Typical amount of pain: none Does pain affect daily life?: no Are you currently prescribed opioids?: no  Dietary Habits and Nutritional Risks How many meals a day?: 2 Eats fruit and vegetables daily?: yes Most meals are obtained by: preparing own meals; eating out In the last 2 weeks, have you had any of the following?: none Diabetic:: no  Functional Status Activities of Daily Living (to include ambulation/medication): Independent Ambulation: Independent with device- listed below Home Assistive Devices/Equipment: Eyeglasses Medication Administration: Independent Home Management (perform basic housework or laundry): Independent Manage your own finances?: yes Primary transportation is: driving Concerns about vision?: no *vision screening is required for WTM* Concerns about hearing?: no  Fall Screening Falls in the past year?: 1 Number of falls in past year: 1 Was there an injury with Fall?: 0 Fall Risk Category Calculator: 2 Patient Fall Risk Level: Moderate Fall Risk  Fall Risk Patient at Risk for Falls Due to: History of fall(s) Fall risk Follow up: Falls evaluation completed  Home and Transportation Safety: All rugs have non-skid backing?: (!) no All stairs or steps have railings?: yes Grab bars in the bathtub or shower?: (!) no Have non-skid surface in bathtub or shower?:  yes Good home lighting?: yes Regular seat belt use?: yes Hospital stays in the last year:: no  Cognitive Assessment Difficulty concentrating, remembering, or making decisions? : no Will 6CIT or Mini Cog be Completed: yes What year is it?: 0 points What month is it?: 0 points Give patient an address phrase to remember (5 components): [address] [place]  About what time is it?: 0 points Count backwards from 20 to 1: 0 points Say the months of the year in reverse: 0 points Repeat the address phrase from earlier: 0 points 6 CIT Score: 0 points  Advance Directives (For Healthcare) Does Patient Have a Medical Advance Directive?: No Type of Advance Directive: Healthcare Power of [place]; Living will Copy of Healthcare Power of Attorney in Chart?: No - copy requested Copy of Living Will in Chart?: No - copy requested  Reviewed/Updated  Reviewed/Updated: Reviewed All (Medical, Surgical, Family, Medications, Allergies, Care Teams, Patient Goals)    Allergies (verified) Shrimp (diagnostic) and Cats claw (uncaria tomentosa)   Current Medications (verified) Outpatient Encounter Medications as of 12/09/2024  Medication Sig   albuterol (VENTOLIN HFA) 108 (90 Base) MCG/ACT inhaler SMARTSIG:1 Puff(s) Via Inhaler Every 4 Hours PRN   ALPRAZolam  (XANAX ) 0.5 MG tablet TAKE 1 TABLET BY MOUTH 2 TIMES DAILY AS NEEDED FOR ANXIETY.   aspirin 81 MG chewable tablet Chew by mouth daily.   B Complex-C (B-COMPLEX WITH VITAMIN C) tablet Take 1 tablet by mouth daily.   benzonatate  (TESSALON  PERLES) 100 MG capsule Take 1 capsule (100 mg total) by mouth 3 (three) times daily as needed.   calcium carbonate (OSCAL) 1500 (600 Ca) MG TABS tablet Take by mouth 2 (two) times daily with a meal.   cetirizine (ZYRTEC) 10 MG tablet Take 10 mg  by mouth daily.   Cholecalciferol (VITAMIN D3) 75 MCG (3000 UT) TABS Take by mouth.   FLUZONE HIGH-DOSE 0.5 ML injection    lisinopril (ZESTRIL) 20 MG tablet TAKE 1 TABLET BY  MOUTH EVERY DAY   meloxicam  (MOBIC ) 15 MG tablet TAKE 1 TABLET (15 MG TOTAL) BY MOUTH DAILY.   MNEXSPIKE 10 MCG/0.2ML SUSY    Multiple Vitamins-Minerals (MULTIVITAMIN WITH MINERALS) tablet Take 1 tablet by mouth daily.   ondansetron  (ZOFRAN ) 4 MG tablet Take 1 tablet (4 mg total) by mouth every 8 (eight) hours as needed for nausea or vomiting.   rosuvastatin (CRESTOR) 5 MG tablet TAKE 1 TABLET BY MOUTH EVERY OTHER DAY   zinc gluconate 50 MG tablet Take 50 mg by mouth daily.   No facility-administered encounter medications on file as of 12/09/2024.    History: Past Medical History:  Diagnosis Date   Hyperlipidemia    Hypertension    Past Surgical History:  Procedure Laterality Date   BREAST BIOPSY Left 1998   benign   BREAST BIOPSY Right 1992   benign   BREAST BIOPSY Right 1991   benign   BREAST BIOPSY Right 1990   benign   CESAREAN SECTION  11/10/1984   KNEE SURGERY  1992   Family History  Problem Relation Age of Onset   COPD Mother    Heart disease Mother    Asthma Mother    Hypertension Mother    Early death Father    Drug abuse Sister    Depression Sister    COPD Sister    Asthma Sister    Arthritis Sister    Alcohol abuse Sister    Hearing loss Sister    Asthma Sister    Early death Maternal Grandmother    Diabetes Maternal Grandfather    Alcohol abuse Paternal Grandmother    Diabetes Paternal Grandmother    Hearing loss Paternal Grandfather    Drug abuse Daughter    Depression Daughter    Alcohol abuse Daughter    Hypertension Son    Hyperlipidemia Son    Drug abuse Son    Depression Son    Alcohol abuse Son    Colon cancer Neg Hx    Colon polyps Neg Hx    Esophageal cancer Neg Hx    Rectal cancer Neg Hx    Stomach cancer Neg Hx    Social History   Occupational History   Not on file  Tobacco Use   Smoking status: Former    Types: Cigarettes   Smokeless tobacco: Never  Vaping Use   Vaping status: Never Used  Substance and Sexual Activity    Alcohol use: Not Currently   Drug use: Never   Sexual activity: Not on file   Tobacco Counseling Counseling given: Not Answered  SDOH Screenings   Food Insecurity: No Food Insecurity (12/09/2024)  Housing: Low Risk (12/09/2024)  Transportation Needs: No Transportation Needs (12/09/2024)  Utilities: Not At Risk (12/09/2024)  Depression (PHQ2-9): Low Risk (12/09/2024)  Financial Resource Strain: Low Risk (12/05/2023)  Physical Activity: Sufficiently Active (12/09/2024)  Social Connections: Moderately Integrated (12/09/2024)  Stress: Stress Concern Present (12/09/2024)  Tobacco Use: Medium Risk (12/09/2024)  Health Literacy: Adequate Health Literacy (12/09/2024)   See flowsheets for full screening details  Depression Screen PHQ 2 & 9 Depression Scale- Over the past 2 weeks, how often have you been bothered by any of the following problems? Little interest or pleasure in doing things: 0 Feeling down, depressed, or hopeless (PHQ  Adolescent also includes...irritable): 1 (family concerns) PHQ-2 Total Score: 1     Goals Addressed               This Visit's Progress     maintain health and activity (pt-stated)        Maintain health and activity       COMPLETED: Patient Stated        Get in shape for a 60 mile hike              Objective:    Today's Vitals   12/09/24 1047  BP: 132/76  Pulse: 81  Temp: 98 F (36.7 C)  SpO2: 99%  Weight: 144 lb (65.3 kg)  Height: 5' 5 (1.651 m)   Body mass index is 23.96 kg/m.  Hearing/Vision screen Hearing Screening - Comments:: Pt denies any hearing issues  Vision Screening - Comments:: Wears rx glasses - up to date with routine eye exams with Dr Robinson  Immunizations and Health Maintenance Health Maintenance  Topic Date Due   COVID-19 Vaccine (7 - 2025-26 season) 07/27/2024   Medicare Annual Wellness (AWV)  12/09/2025   DTaP/Tdap/Td (4 - Td or Tdap) 04/04/2032   Pneumococcal Vaccine: 50+ Years  Completed   Influenza Vaccine   Completed   Bone Density Scan  Completed   Hepatitis C Screening  Completed   Zoster Vaccines- Shingrix  Completed   Meningococcal B Vaccine  Aged Out   Hepatitis B Vaccines 19-59 Average Risk  Discontinued   Mammogram  Discontinued   Colonoscopy  Discontinued        Assessment/Plan:  This is a routine wellness examination for Elisha.  Patient Care Team: Kennyth Worth HERO, MD as PCP - General (Family Medicine)  I have personally reviewed and noted the following in the patients chart:   Medical and social history Use of alcohol, tobacco or illicit drugs  Current medications and supplements including opioid prescriptions. Functional ability and status Nutritional status Physical activity Advanced directives List of other physicians Hospitalizations, surgeries, and ER visits in previous 12 months Vitals Screenings to include cognitive, depression, and falls Referrals and appointments  No orders of the defined types were placed in this encounter.  In addition, I have reviewed and discussed with patient certain preventive protocols, quality metrics, and best practice recommendations. A written personalized care plan for preventive services as well as general preventive health recommendations were provided to patient.   Ellouise VEAR Haws, LPN   8/85/7973   Return in about 1 year (around 12/13/2025).  After Visit Summary: (In Person-Printed) AVS printed and given to the patient  Nurse Notes: HM Addressed: Vaccines Due: and discussed   "

## 2024-12-09 NOTE — Patient Instructions (Signed)
 [name],  Thank you for taking the time for your Medicare Wellness Visit. I appreciate your continued commitment to your health goals. Please review the care plan we discussed, and feel free to reach out if I can assist you further.  Please note that Annual Wellness Visits do not include a physical exam. Some assessments may be limited, especially if the visit was conducted virtually. If needed, we may recommend an in-person follow-up with your provider.  Ongoing Care Seeing your primary care provider every 3 to 6 months helps us  monitor your health and provide consistent, personalized care.  Referrals If a referral was made during today's visit and you haven't received any updates within two weeks, please contact the referred provider directly to check on the status.  Recommended Screenings:  Health Maintenance  Topic Date Due   COVID-19 Vaccine (7 - 2025-26 season) 07/27/2024   Medicare Annual Wellness Visit  12/09/2025   DTaP/Tdap/Td vaccine (2 - Td or Tdap) 04/04/2032   Pneumococcal Vaccine for age over 84  Completed   Flu Shot  Completed   Osteoporosis screening with Bone Density Scan  Completed   Hepatitis C Screening  Completed   Zoster (Shingles) Vaccine  Completed   Meningitis B Vaccine  Aged Out   Hepatitis B Vaccine  Discontinued   Breast Cancer Screening  Discontinued   Colon Cancer Screening  Discontinued       06/05/2024   10:08 PM  Advanced Directives  Does Patient Have a Medical Advance Directive? No    Vision: Annual vision screenings are recommended for early detection of glaucoma, cataracts, and diabetic retinopathy. These exams can also reveal signs of chronic conditions such as diabetes and high blood pressure.  Dental: Annual dental screenings help detect early signs of oral cancer, gum disease, and other conditions linked to overall health, including heart disease and diabetes.  Please see the attached documents for additional preventive care  recommendations.

## 2025-05-27 ENCOUNTER — Encounter: Admitting: Family Medicine

## 2025-12-14 ENCOUNTER — Ambulatory Visit
